# Patient Record
Sex: Female | Born: 1965 | Race: White | Hispanic: No | Smoking: Former smoker
Health system: Southern US, Community
[De-identification: ages and names within clinical notes are randomized; demographics above are authoritative.]

## PROBLEM LIST (undated history)

## (undated) DIAGNOSIS — D649 Anemia, unspecified: Secondary | ICD-10-CM

## (undated) DIAGNOSIS — F419 Anxiety disorder, unspecified: Secondary | ICD-10-CM

## (undated) DIAGNOSIS — F32A Depression, unspecified: Secondary | ICD-10-CM

## (undated) DIAGNOSIS — T7840XA Allergy, unspecified, initial encounter: Secondary | ICD-10-CM

## (undated) DIAGNOSIS — F329 Major depressive disorder, single episode, unspecified: Secondary | ICD-10-CM

## (undated) HISTORY — DX: Major depressive disorder, single episode, unspecified: F32.9

## (undated) HISTORY — DX: Allergy, unspecified, initial encounter: T78.40XA

## (undated) HISTORY — DX: Depression, unspecified: F32.A

## (undated) HISTORY — PX: ENDOMETRIAL ABLATION: SHX621

## (undated) HISTORY — DX: Anxiety disorder, unspecified: F41.9

## (undated) HISTORY — DX: Anemia, unspecified: D64.9

---

## 2007-09-04 ENCOUNTER — Other Ambulatory Visit: Admission: RE | Admit: 2007-09-04 | Discharge: 2007-09-04 | Payer: Self-pay | Admitting: Family Medicine

## 2007-09-04 ENCOUNTER — Encounter: Payer: Self-pay | Admitting: Family Medicine

## 2007-09-04 ENCOUNTER — Ambulatory Visit: Payer: Self-pay | Admitting: Family Medicine

## 2007-09-04 DIAGNOSIS — F329 Major depressive disorder, single episode, unspecified: Secondary | ICD-10-CM

## 2007-09-04 DIAGNOSIS — N926 Irregular menstruation, unspecified: Secondary | ICD-10-CM | POA: Insufficient documentation

## 2007-09-04 DIAGNOSIS — D239 Other benign neoplasm of skin, unspecified: Secondary | ICD-10-CM | POA: Insufficient documentation

## 2007-09-04 LAB — CONVERTED CEMR LAB
Bilirubin Urine: NEGATIVE
Blood in Urine, dipstick: NEGATIVE
Glucose, Urine, Semiquant: NEGATIVE
Ketones, urine, test strip: NEGATIVE
Protein, U semiquant: NEGATIVE

## 2007-09-08 ENCOUNTER — Encounter (INDEPENDENT_AMBULATORY_CARE_PROVIDER_SITE_OTHER): Payer: Self-pay | Admitting: *Deleted

## 2007-09-08 LAB — CONVERTED CEMR LAB
ALT: 21 units/L (ref 0–35)
Albumin: 3.5 g/dL (ref 3.5–5.2)
Alkaline Phosphatase: 75 units/L (ref 39–117)
BUN: 5 mg/dL — ABNORMAL LOW (ref 6–23)
Basophils Absolute: 0 10*3/uL (ref 0.0–0.1)
Calcium: 9.1 mg/dL (ref 8.4–10.5)
Cholesterol: 193 mg/dL (ref 0–200)
Creatinine, Ser: 0.6 mg/dL (ref 0.4–1.2)
HDL: 75.9 mg/dL (ref >39.0)
Hemoglobin: 12.6 g/dL (ref 12.0–15.0)
LDL Cholesterol: 104 mg/dL — ABNORMAL HIGH (ref 0–99)
MCHC: 34.2 g/dL (ref 30.0–36.0)
Monocytes Absolute: 0.6 10*3/uL (ref 0.2–0.7)
Monocytes Relative: 12.8 % — ABNORMAL HIGH (ref 3.0–11.0)
Platelets: 295 10*3/uL (ref 150–400)
Potassium: 3.8 meq/L (ref 3.5–5.1)
RBC: 4.06 M/uL (ref 3.87–5.11)
RDW: 13.6 % (ref 11.5–14.6)
Total Bilirubin: 0.4 mg/dL (ref 0.3–1.2)
Total CHOL/HDL Ratio: 2.5
Triglycerides: 65 mg/dL (ref 0–149)
VLDL: 13 mg/dL (ref 0–40)

## 2007-09-15 ENCOUNTER — Encounter: Payer: Self-pay | Admitting: Family Medicine

## 2007-09-19 ENCOUNTER — Encounter: Admission: RE | Admit: 2007-09-19 | Discharge: 2007-09-19 | Payer: Self-pay | Admitting: Family Medicine

## 2007-09-26 ENCOUNTER — Encounter (INDEPENDENT_AMBULATORY_CARE_PROVIDER_SITE_OTHER): Payer: Self-pay | Admitting: *Deleted

## 2007-09-30 ENCOUNTER — Encounter: Payer: Self-pay | Admitting: Family Medicine

## 2007-10-04 ENCOUNTER — Encounter: Payer: Self-pay | Admitting: Family Medicine

## 2007-10-10 ENCOUNTER — Encounter: Payer: Self-pay | Admitting: Family Medicine

## 2007-10-16 ENCOUNTER — Encounter: Admission: RE | Admit: 2007-10-16 | Discharge: 2007-10-16 | Payer: Self-pay | Admitting: Family Medicine

## 2007-10-22 ENCOUNTER — Encounter: Admission: RE | Admit: 2007-10-22 | Discharge: 2007-10-22 | Payer: Self-pay | Admitting: Family Medicine

## 2007-10-24 ENCOUNTER — Encounter (INDEPENDENT_AMBULATORY_CARE_PROVIDER_SITE_OTHER): Payer: Self-pay | Admitting: *Deleted

## 2010-02-08 ENCOUNTER — Encounter: Admission: RE | Admit: 2010-02-08 | Discharge: 2010-02-08 | Payer: Self-pay | Admitting: Internal Medicine

## 2010-07-20 ENCOUNTER — Ambulatory Visit: Payer: Self-pay | Admitting: Obstetrics & Gynecology

## 2010-07-25 ENCOUNTER — Ambulatory Visit (HOSPITAL_COMMUNITY): Admission: RE | Admit: 2010-07-25 | Discharge: 2010-07-25 | Payer: Self-pay | Admitting: Obstetrics and Gynecology

## 2010-08-10 ENCOUNTER — Ambulatory Visit: Payer: Self-pay | Admitting: Obstetrics and Gynecology

## 2010-09-27 ENCOUNTER — Ambulatory Visit (HOSPITAL_COMMUNITY)
Admission: RE | Admit: 2010-09-27 | Discharge: 2010-09-27 | Payer: Self-pay | Source: Home / Self Care | Attending: Family Medicine | Admitting: Family Medicine

## 2010-10-20 ENCOUNTER — Ambulatory Visit
Admission: RE | Admit: 2010-10-20 | Discharge: 2010-10-20 | Payer: Self-pay | Source: Home / Self Care | Attending: Obstetrics and Gynecology | Admitting: Obstetrics and Gynecology

## 2010-10-22 ENCOUNTER — Encounter: Payer: Self-pay | Admitting: Family Medicine

## 2010-11-09 ENCOUNTER — Ambulatory Visit: Payer: Self-pay | Admitting: Family Medicine

## 2010-12-11 LAB — CBC
HCT: 42.1 % (ref 36.0–46.0)
MCV: 90.5 fL (ref 78.0–100.0)
RBC: 4.65 MIL/uL (ref 3.87–5.11)
RDW: 13.2 % (ref 11.5–15.5)
WBC: 5.9 10*3/uL (ref 4.0–10.5)

## 2010-12-21 NOTE — Progress Notes (Signed)
NAMEKASANDRA, FEHR               ACCOUNT NO.:  [id number]  MEDICAL RECORD NO.:  [id number]           PATIENT TYPE:  LOCATION:  [facility]                     FACILITY:  PHYSICIAN:  Tinnie Gens, MD        DATE OF BIRTH:  1966-09-19  DATE OF SERVICE:                                 CLINIC NOTE  CHIEF COMPLAINT:  Postop hysteroscopy with hydrothermal ablation followup.  HISTORY OF PRESENT ILLNESS:  Ms. Hauk is a 45 year old, gravida 2, para 1-0-1-1, who comes in for a followup appointment for status post hysteroscopy with hydrothermal ablation on September 27, 2010.  The patient has a history of dysfunctional uterine bleeding for the past 6 months.  Prior to her surgery, she underwent endometrial sampling and pelvic sonography.  Endometrial sampling showed benign proliferative- type endometrium, no evidence of hyperplasia or malignancy.  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  This fibroid is submucosal in nature and deviated endometrial stripe.  Endometrium was 5 mm in maximal thickness.  The patient has been doing well since her surgery.  She states she is continuing to have some spotting, goes about 2 pads a day.  She denies any pain at this time.  Pathology reports and operative reports were reviewed today.  The patient's main question for today is when she would stop bleeding.  She has been taking ibuprofen initially after her surgery for pain.  She states that this controlled her pain well.  She does not need to take ibuprofen anymore.  PHYSICAL EXAMINATION:  VITAL SIGNS:  Temperature of 97.6, pulse 96, blood pressure 106/76, weight 155.8 pounds, height 64 inches. GENERAL:  Pleasant women, appears stated age, in no acute distress. ABDOMEN:  Soft, nondistended, and nontender.  Benign abdomen. GYNECOLOGIC:  External genitalia within normal limits.  No lesions or abnormalities noted.   Vagina is well rugated with some mucous discharge that is tanned in nature.  The tip is closed from the anterior position. The uterus is small and retroverted.  Adnexa without masses or tenderness.  Also no uterine tenderness.  However, is not directly palpable. RECTAL:  Deferred today. EXTREMITIES:  No clubbing, cyanosis, edema, or erythema noted.  ASSESSMENT AND PLAN:  Ms. Barcenas is a 45 year old female here for postop followup, following hysteroscopy with hydrothermal ablation.  I did discuss this patient with Dr. Shawnie Pons.  She agreed with the plan.  The patient's bleeding has greatly decreased since her surgery.  She is going with 2 pads a day at this time.  I reassured the patient that her bleeding should stop by 4-6 weeks completely.  We will have the patient follow up in 6-8 weeks after her surgery to ensure that her bleeding has lightened and stopped at that point of time.  Of note, on review of the records, the patient has not had a Pap smear since 2008.  Pap smear at that time was within normal limits.  She states she has a distant history of abnormal Pap smear, but has never been referred for a  colposcopy.  We will need to obtain a Pap smear once in the next few months.  Also of note, the patient did have elevated [facility] sent from her primary care physician.  This FSH was 36.2, which is somewhat elevated. The patient is obviously still bleeding and therefore does not seem to be perimenopausal at this time.  She does have a history of irregular menses since she was a teenager.  She states lab work was not obtained or at least not send over from primary care physician.  If the patient has any symptoms, we will follow up with perimenopausal workup.    ______________________________ Tinnie Gens, MD   ______________________________ Tinnie Gens, MD   TP/MEDQ  D:  10/20/2010  T:  10/21/2010  Job:  [id number]

## 2011-01-01 ENCOUNTER — Ambulatory Visit: Payer: Self-pay | Admitting: Occupational Therapy

## 2011-01-01 ENCOUNTER — Other Ambulatory Visit: Payer: Self-pay

## 2011-01-01 DIAGNOSIS — N920 Excessive and frequent menstruation with regular cycle: Secondary | ICD-10-CM

## 2011-01-01 DIAGNOSIS — Z09 Encounter for follow-up examination after completed treatment for conditions other than malignant neoplasm: Secondary | ICD-10-CM

## 2011-01-02 NOTE — Progress Notes (Signed)
Heather Rocha, Heather Rocha               ACCOUNT NO.:  [id number]  MEDICAL RECORD NO.:  [id number]           PATIENT TYPE:  A  LOCATION:  [facility]                   FACILITY:  [facility]  PHYSICIAN:  Maylon Cos, CNM    DATE OF BIRTH:  1966-01-24  DATE OF SERVICE:  01/01/2011                                 CLINIC NOTE  CHIEF COMPLAINT:  Postop recheck status post hysteroscopy with ablation on  September 27, 2010.  HISTORY OF PRESENT ILLNESS:  Ms. Heather Rocha had an ablation on September 27, 2010, for dysfunctional uterine bleeding from 6 months.  She had a postop visit on October 19, 2010, was still having some light bleeding at that time.  Today, she reports the bleeding stopped in January and has not had any bleeding since.  She denies any pain.  She did have some questions regarding her menstrual cycles.  She had a short period of time where her breast became tender and she had some irritability and mood changes in February, but no bleeding at that time other than that she is without complaints.  Her last Pap smear was 3 years ago.  She does have a remote history of abnormal Pap smear that did not require any follow up, but she is not sure about the details of that.  The patient complains of some vaginal odor and reports that she douches on occasion, but no complaints of discharge.  SUBJECTIVE:  GENERAL:  A well-appearing adult female in no acute distress. PELVIC:  External genitalia within normal limits.  Vaginal mucosa normal.  Cervix normal-appearing, small amount of clear mucus discharge. No cervical motion tenderness.  Uterus is not enlarged and nontender. Adnexa are not enlarged and nontender.  ASSESSMENT AND PLAN:  A 45 year old G2, P1-0-1-1 here for postop followup and has had a normal postop course.  Pap smear was collected today.  The patient was instructed to follow up if she began having more problems with abnormal bleeding.  Otherwise, will follow up as indicated by her Pap  smear.          ______________________________ Maylon Cos, CNM    SS/MEDQ  D:  01/01/2011  T:  01/02/2011  Job:  [id number]

## 2011-05-21 ENCOUNTER — Other Ambulatory Visit (HOSPITAL_COMMUNITY): Payer: Self-pay | Admitting: Internal Medicine

## 2011-05-21 DIAGNOSIS — Z1231 Encounter for screening mammogram for malignant neoplasm of breast: Secondary | ICD-10-CM

## 2011-05-23 ENCOUNTER — Ambulatory Visit (HOSPITAL_COMMUNITY)
Admission: RE | Admit: 2011-05-23 | Discharge: 2011-05-23 | Disposition: A | Discharge status: Home / Self Care | Payer: Self-pay | Source: Ambulatory Visit | Attending: Internal Medicine | Admitting: Internal Medicine

## 2011-05-23 DIAGNOSIS — Z1231 Encounter for screening mammogram for malignant neoplasm of breast: Secondary | ICD-10-CM

## 2013-10-19 ENCOUNTER — Ambulatory Visit (INDEPENDENT_AMBULATORY_CARE_PROVIDER_SITE_OTHER): Payer: Managed Care, Other (non HMO) | Admitting: Family Medicine

## 2013-10-19 ENCOUNTER — Ambulatory Visit: Payer: Managed Care, Other (non HMO)

## 2013-10-19 VITALS — BP 122/88 | HR 91 | Temp 98.6°F | Resp 16 | Ht 63.0 in | Wt 162.4 lb

## 2013-10-19 DIAGNOSIS — Z1231 Encounter for screening mammogram for malignant neoplasm of breast: Secondary | ICD-10-CM

## 2013-10-19 DIAGNOSIS — D171 Benign lipomatous neoplasm of skin and subcutaneous tissue of trunk: Secondary | ICD-10-CM

## 2013-10-19 DIAGNOSIS — L989 Disorder of the skin and subcutaneous tissue, unspecified: Secondary | ICD-10-CM

## 2013-10-19 DIAGNOSIS — D1779 Benign lipomatous neoplasm of other sites: Secondary | ICD-10-CM

## 2013-10-19 LAB — POCT CBC
GRANULOCYTE PERCENT: 51.2 % (ref 37–80)
HEMATOCRIT: 41.1 % (ref 37.7–47.9)
HEMOGLOBIN: 12.9 g/dL (ref 12.2–16.2)
Lymph, poc: 2.6 (ref 0.6–3.4)
MCH, POC: 29.7 pg (ref 27–31.2)
MCHC: 31.4 g/dL — AB (ref 31.8–35.4)
MCV: 94.5 fL (ref 80–97)
MID (cbc): 0.4 (ref 0–0.9)
MPV: 7.1 fL (ref 0–99.8)
POC GRANULOCYTE: 3.1 (ref 2–6.9)
POC LYMPH %: 42.2 % (ref 10–50)
POC MID %: 6.6 %M (ref 0–12)
Platelet Count, POC: 268 10*3/uL (ref 142–424)
RBC: 4.35 M/uL (ref 4.04–5.48)
RDW, POC: 13.3 %
WBC: 6.1 10*3/uL (ref 4.6–10.2)

## 2013-10-19 NOTE — Progress Notes (Signed)
Subjective:    Patient ID: Heather Rocha, female    DOB: Oct 08, 1965, 48 y.o.   MRN: [id number]  HPI Primary Physician: Lamar Blinks, MD  Chief Complaint: "Knot" on left side of her back  HPI: 48 y.o. female with history below presents with a reported 1 day history of swelling mass along the left flank. Patient reports the previous evening she was drying off after a shower and felt a lump along her left flank that she had never felt previously. She felt along the contralateral side and did not feel a similar one. She had recently gotten a massage from one of her coworkers the day before, she works as a Geophysicist/field seismologist, so she called her and asked her if she felt the area in question and the coworker replied that she did not. The patient has been scared to touch the lesion, lean back on the lesion when sitting, or lay on the lesion. She has remained afebrile and without chills. She has had night sweats for some time now, but believes that she is perimenopausal. No pruritis.   Of note, her mother had Hodgkin's lymphoma, breast cancer, and "skin" cancer. Her father also had lung cancer and passed at age 72. He was a smoker and did not go to doctor's the patient reports.   The patient was an on and off smoker from ages [age]. She would not smoke if at work or dating someone that did not smoke. A pack would last about 3 days. She has used Vape since May 2014.    Past Medical History  Diagnosis Date  . Allergy   . Anemia   . Anxiety   . Depression      Home Meds: Prior to Admission medications   Medication Sig Start Date End Date Taking? Authorizing Provider  ferrous sulfate (IRON SUPPLEMENT) 325 (65 FE) MG tablet Take 325 mg by mouth daily with breakfast.   Yes Historical Provider, MD  oxybutynin (DITROPAN) 5 MG tablet Take 5 mg by mouth 2 (two) times daily.   Yes Historical Provider, MD    Allergies: No Known Allergies  History   Social History  . Marital Status: Divorced      Spouse Name: N/A    Number of Children: N/A  . Years of Education: N/A   Occupational History  . Not on file.   Social History Main Topics  . Smoking status: Former Research scientist (life sciences)  . Smokeless tobacco: Not on file  . Alcohol Use: Not on file  . Drug Use: Not on file  . Sexual Activity: Not on file   Other Topics Concern  . Not on file   Social History Narrative  . No narrative on file      Review of Systems  Constitutional: Negative for fever, chills, appetite change, fatigue and unexpected weight change.  Respiratory: Negative for cough, shortness of breath, wheezing and stridor.   Cardiovascular: Negative for chest pain, palpitations and leg swelling.  Musculoskeletal: Positive for arthralgias and back pain.  Skin: Negative for color change, pallor, rash and wound.  Hematological: Negative for adenopathy. Does not bruise/bleed easily.       Objective:   Physical Exam  Physical Exam: Blood pressure 122/88, pulse 91, temperature 98.6 F (37 C), temperature source Oral, resp. rate 16, height 5\' 3"  (1.6 m), weight 162 lb 6.4 oz (73.664 kg), SpO2 99.00%., Body mass index is 28.78 kg/(m^2). General: Well developed, well nourished, in no acute distress. Head: Normocephalic, atraumatic, eyes without discharge,  sclera non-icteric, nares are without discharge. Bilateral auditory canals clear, TM's are without perforation, pearly grey and translucent with reflective cone of light bilaterally. Oral cavity moist, posterior pharynx without exudate, erythema, peritonsillar abscess, or post nasal drip. Uvula midline.   Neck: Supple. No thyromegaly. Full ROM. No lymphadenopathy along neck or supraclavicular regions.  Lungs: Clear bilaterally to auscultation without wheezes, rales, or rhonchi. Breathing is unlabored. Heart: RRR with S1 S2. No murmurs, rubs, or gallops appreciated. Msk:  Strength and tone normal for age. Extremities/Skin: Warm and dry. No clubbing or cyanosis. No edema. No  rashes or suspicious lesions. Back: 2 cm x 1 cm mobile non tender slightly firm mass just to the inferior aspect of the 12th rib along the left flank. No erythema or ecchymosis. No induration or fluctuance.  Neuro: Alert and oriented X 3. Moves all extremities spontaneously. Gait is normal. CNII-XII grossly in tact. Psych:  Responds to questions appropriately with a normal affect.    Labs: Results for orders placed in visit on 10/19/13  POCT CBC      Result Value Range   WBC 6.1  4.6 - 10.2 K/uL   Lymph, poc 2.6  0.6 - 3.4   POC LYMPH PERCENT 42.2  10 - 50 %L   MID (cbc) 0.4  0 - 0.9   POC MID % 6.6  0 - 12 %M   POC Granulocyte 3.1  2 - 6.9   Granulocyte percent 51.2  37 - 80 %G   RBC 4.35  4.04 - 5.48 M/uL   Hemoglobin 12.9  12.2 - 16.2 g/dL   HCT, POC 41.1  37.7 - 47.9 %   MCV 94.5  80 - 97 fL   MCH, POC 29.7  27 - 31.2 pg   MCHC 31.4 (*) 31.8 - 35.4 g/dL   RDW, POC 13.3     Platelet Count, POC 268  142 - 424 K/uL   MPV 7.1  0 - 99.8 fL    CXR:  [facility] reading (PRIMARY) by  Dr. Carlota Raspberry. Negative       Assessment & Plan:  48 year old female with lipoma and here to establish care -Offered patient general surgery referral for further evaluation/second opinion. She defers this at this time. She will accept this should the lesion change in any way -Advised patient to RTC in 4-6 if lesion is bother some, sooner if worse or with changes -Screening [facility] referral made   Christell Faith, MHS, PA-C [facility] [address] [place], [place] [address] [place] 10/19/2013 6:15 PM

## 2013-10-21 NOTE — Progress Notes (Signed)
Xray read and patient discussed with Christell Faith, PA-C. Agree with assessment and plan of care per his note.

## 2013-10-28 ENCOUNTER — Telehealth (HOSPITAL_COMMUNITY): Payer: Self-pay | Admitting: Psychiatry

## 2013-11-01 NOTE — Telephone Encounter (Signed)
Erroneous Encounter. Please ignore.

## 2014-04-12 ENCOUNTER — Ambulatory Visit (INDEPENDENT_AMBULATORY_CARE_PROVIDER_SITE_OTHER): Payer: Managed Care, Other (non HMO) | Admitting: Family Medicine

## 2014-04-12 ENCOUNTER — Other Ambulatory Visit: Payer: Self-pay | Admitting: Family Medicine

## 2014-04-12 VITALS — BP 98/80 | HR 75 | Temp 97.7°F | Resp 16 | Ht 62.5 in | Wt 170.6 lb

## 2014-04-12 DIAGNOSIS — R400 Somnolence: Secondary | ICD-10-CM

## 2014-04-12 DIAGNOSIS — Z13220 Encounter for screening for lipoid disorders: Secondary | ICD-10-CM

## 2014-04-12 DIAGNOSIS — R0989 Other specified symptoms and signs involving the circulatory and respiratory systems: Secondary | ICD-10-CM

## 2014-04-12 DIAGNOSIS — R0609 Other forms of dyspnea: Secondary | ICD-10-CM

## 2014-04-12 DIAGNOSIS — Z0000 Encounter for general adult medical examination without abnormal findings: Secondary | ICD-10-CM

## 2014-04-12 DIAGNOSIS — R3915 Urgency of urination: Secondary | ICD-10-CM

## 2014-04-12 DIAGNOSIS — R5383 Other fatigue: Secondary | ICD-10-CM

## 2014-04-12 DIAGNOSIS — G4710 Hypersomnia, unspecified: Secondary | ICD-10-CM

## 2014-04-12 DIAGNOSIS — Z20820 Contact with and (suspected) exposure to varicella: Secondary | ICD-10-CM

## 2014-04-12 DIAGNOSIS — Z124 Encounter for screening for malignant neoplasm of cervix: Secondary | ICD-10-CM

## 2014-04-12 DIAGNOSIS — D1739 Benign lipomatous neoplasm of skin and subcutaneous tissue of other sites: Secondary | ICD-10-CM

## 2014-04-12 DIAGNOSIS — R5381 Other malaise: Secondary | ICD-10-CM

## 2014-04-12 DIAGNOSIS — R0683 Snoring: Secondary | ICD-10-CM

## 2014-04-12 DIAGNOSIS — D171 Benign lipomatous neoplasm of skin and subcutaneous tissue of trunk: Secondary | ICD-10-CM

## 2014-04-12 LAB — CBC WITH DIFFERENTIAL/PLATELET
Basophils Absolute: 0 10*3/uL (ref 0.0–0.1)
Basophils Relative: 0 % (ref 0–1)
EOS ABS: 0.1 10*3/uL (ref 0.0–0.7)
EOS PCT: 2 % (ref 0–5)
HEMATOCRIT: 39.6 % (ref 36.0–46.0)
HEMOGLOBIN: 13.5 g/dL (ref 12.0–15.0)
LYMPHS ABS: 2 10*3/uL (ref 0.7–4.0)
LYMPHS PCT: 34 % (ref 12–46)
MCH: 29.8 pg (ref 26.0–34.0)
MCHC: 34.1 g/dL (ref 30.0–36.0)
MCV: 87.4 fL (ref 78.0–100.0)
MONOS PCT: 7 % (ref 3–12)
Monocytes Absolute: 0.4 10*3/uL (ref 0.1–1.0)
Neutro Abs: 3.3 10*3/uL (ref 1.7–7.7)
Neutrophils Relative %: 57 % (ref 43–77)
PLATELETS: 328 10*3/uL (ref 150–400)
RBC: 4.53 MIL/uL (ref 3.87–5.11)
RDW: 13.4 % (ref 11.5–15.5)
WBC: 5.8 10*3/uL (ref 4.0–10.5)

## 2014-04-12 MED ORDER — TOLTERODINE TARTRATE ER 2 MG PO CP24: 2.0000 mg | ORAL_CAPSULE | Freq: Every day | ORAL | Status: DC

## 2014-04-12 NOTE — Progress Notes (Signed)
[facility] [address], [place] [address] [phone]  Date:  04/12/2014   Name:  Heather Rocha   DOB:  May 05, 1966   MRN:  [id number]  PCP:  Lamar Blinks, MD    Chief Complaint: Annual Exam   History of Present Illness:  Heather Rocha is a 48 y.o. very pleasant female patient who presents with the following:  Here today for a CPE.  She is generally healthy.  She had been on ditropan for her bladder. She quit taking it recently and she felt better- she got rid of her headaches and diarrhea/ nausea.  She stopped her ditropan about 2 weeks ago. She has had bladder issues for a couple of years.  She had been just taking it before work, and it did seem to help with her frequency and urgency.  She does have to wear a pad right now.   Also, she has noted some hot flashes. Also, her roommate has noted that she does some snoring and "jerking" while she is asleep at night.  She does notice some daytime somnolence.    Also, a few years ago a limb hit her in the head and she needed stitches.  Her mother wondered if this might lead to an aneurysm. She does feel tired a lot of the time.  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    She does notice a small "fat pocket" on her left flank.  Not sure if this might be of any concern. Also she has not had chicken pox as far as she knows; wonders if she is at risk for this disease and if she needs a shingles shot  She did have a mammogram about 3 years ago.   She does drink coffee every day.   She is fasting today for labs  She does not have menses now that she had an ablation.    Patient Active Problem List   Diagnosis Date Noted  . MOLE 09/04/2007  . DEPRESSION 09/04/2007  . IRREGULAR MENSTRUAL CYCLE 09/04/2007    Past Medical History  Diagnosis Date  . Allergy   . Anemia   . Anxiety   . Depression     Past Surgical History  Procedure Laterality Date  . Endometrial  ablation      History  Substance Use Topics  . Smoking status: Former Research scientist (life sciences)  . Smokeless tobacco: Not on file  . Alcohol Use: Not on file    Family History  Problem Relation Age of Onset  . Cancer Mother     Hodgkin's Lymphoma, Breast, "skin"   . Cancer Father     Lung, smoker  . Mental illness Sister   . Cancer Maternal Grandmother   . Stroke Paternal Grandmother     No Known Allergies  Medication list has been reviewed and updated.  Current Outpatient Prescriptions on File Prior to Visit  Medication Sig Dispense Refill  . ferrous sulfate (IRON SUPPLEMENT) 325 (65 FE) MG tablet Take 325 mg by mouth daily with breakfast.      . oxybutynin (DITROPAN) 5 MG tablet Take 5 mg by mouth 2 (two) times daily.       No current facility-administered medications on file prior to visit.    Review of Systems:  As per HPI- otherwise negative.   Physical Examination: Filed Vitals:   04/12/14 1143  BP: 98/80  Pulse: 75  Temp: 97.7 F (36.5 C)  Resp: 16   Filed Vitals:   04/12/14 1143  Height: 5' 2.5" (1.588 m)  Weight: 170 lb 9.6 oz (77.384 kg)   Body mass index is 30.69 kg/(m^2). Ideal Body Weight: Weight in (lb) to have BMI = 25: 138.6  GEN: WDWN, NAD, Non-toxic, A & O x 3, overweight, looks well.  Mild sunburn HEENT: Atraumatic, Normocephalic. Neck supple. No masses, No LAD.  Bilateral TM wnl, oropharynx normal.  PEERL,EOMI.   Ears and Nose: No external deformity. CV: RRR, No M/G/R. No JVD. No thrill. No extra heart sounds. PULM: CTA B, no wheezes, crackles, rhonchi. No retractions. No resp. distress. No accessory muscle use. ABD: S, NT, ND, +BS. No rebound. No HSM. EXTR: No c/c/e NEURO Normal gait.  PSYCH: Normally interactive. Conversant. Not depressed or anxious appearing.  Calm demeanor.  Breast: normal exam, no masses/ dimpling/ discharge Pelvic: normal, no vaginal lesions or discharge. Uterus normal, no CMT, no adnexal tendereness or masses There is a small,  mobile, rounded lesion under the dermis of the left flank.  Suspect a cyst or lipoma.  The size of a halved ping-pong ball.    Assessment and Plan: Snoring - Plan: Nocturnal polysomnography (NPSG)  Daytime somnolence - Plan: Nocturnal polysomnography (NPSG)  Physical exam - Plan: CBC with Differential, Comprehensive metabolic panel  Other malaise and fatigue - Plan: TSH  Screening for cervical cancer - Plan: Pap IG, CT/NG w/ reflex HPV when ASC-U  Varicella contact - Plan: Varicella zoster antibody, IgM  Screening for hyperlipidemia - Plan: Lipid panel  Lipoma of flank - Plan: [place] Misc Soft Tissue  Labs pending as above.  Suspect she may have had mild chicken pox- will check a titer Pap pending, she will schedule a mammogram Fatigue and daytime somnolence: check a sleep study See patient instructions for more details.     Signed Lamar Blinks, MD

## 2014-04-12 NOTE — Patient Instructions (Addendum)
Please schedule a mammogram at your convenience- you might try the breast center at [place] or [place] Address: [address], [place], [place] [address] [phone]  [place],  [address]  Phone: [phone]  We will arrange for a sleep study, and an ultrasound of the area on your flank.  We will give you a call regarding these tests.    I will be in touch with your labs asap.  If you could bring in your vaccination schedule that would be great.

## 2014-04-13 LAB — COMPREHENSIVE METABOLIC PANEL
ALT: 56 U/L — AB (ref 0–35)
AST: 37 U/L (ref 0–37)
Albumin: 4.3 g/dL (ref 3.5–5.2)
Alkaline Phosphatase: 115 U/L (ref 39–117)
BILIRUBIN TOTAL: 0.6 mg/dL (ref 0.2–1.2)
BUN: 12 mg/dL (ref 6–23)
CALCIUM: 9 mg/dL (ref 8.4–10.5)
CHLORIDE: 104 meq/L (ref 96–112)
CO2: 26 meq/L (ref 19–32)
CREATININE: 0.57 mg/dL (ref 0.50–1.10)
GLUCOSE: 80 mg/dL (ref 70–99)
Potassium: 4.3 mEq/L (ref 3.5–5.3)
Sodium: 140 mEq/L (ref 135–145)
TOTAL PROTEIN: 7.1 g/dL (ref 6.0–8.3)

## 2014-04-13 LAB — LIPID PANEL
CHOLESTEROL: 202 mg/dL — AB (ref 0–200)
HDL: 73 mg/dL (ref >39)
LDL Cholesterol: 118 mg/dL — ABNORMAL HIGH (ref 0–99)
Total CHOL/HDL Ratio: 2.8 Ratio
Triglycerides: 53 mg/dL (ref <150)
VLDL: 11 mg/dL (ref 0–40)

## 2014-04-13 LAB — TSH: TSH: 3.46 u[IU]/mL (ref 0.350–4.500)

## 2014-04-13 LAB — PAP IG, CT-NG, RFX HPV ASCU
CHLAMYDIA PROBE AMP: NEGATIVE
GC PROBE AMP: NEGATIVE

## 2014-04-13 LAB — VARICELLA ZOSTER ANTIBODY, IGM: Varicella Zoster Ab IgM: 0.04 {ISR} (ref <0.91)

## 2014-04-14 ENCOUNTER — Encounter: Payer: Self-pay | Admitting: Family Medicine

## 2014-04-15 ENCOUNTER — Ambulatory Visit
Admission: RE | Admit: 2014-04-15 | Discharge: 2014-04-15 | Disposition: A | Discharge status: Home / Self Care | Payer: Managed Care, Other (non HMO) | Source: Ambulatory Visit | Attending: Family Medicine | Admitting: Family Medicine

## 2014-04-15 DIAGNOSIS — D171 Benign lipomatous neoplasm of skin and subcutaneous tissue of trunk: Secondary | ICD-10-CM

## 2014-04-15 LAB — VARICELLA ZOSTER ANTIBODY, IGG: Varicella IgG: 517.9 Index — ABNORMAL HIGH (ref <135.00)

## 2014-04-18 ENCOUNTER — Encounter: Payer: Self-pay | Admitting: Family Medicine

## 2014-05-03 ENCOUNTER — Telehealth: Payer: Self-pay | Admitting: *Deleted

## 2014-05-03 NOTE — Telephone Encounter (Signed)
Spoke with patient regarding scheduling for a sleep study, per patient has an appointment for Sept. 16, 15 at [facility].

## 2014-05-05 ENCOUNTER — Ambulatory Visit (INDEPENDENT_AMBULATORY_CARE_PROVIDER_SITE_OTHER): Payer: Managed Care, Other (non HMO) | Admitting: Family Medicine

## 2014-05-05 ENCOUNTER — Ambulatory Visit (INDEPENDENT_AMBULATORY_CARE_PROVIDER_SITE_OTHER): Payer: Managed Care, Other (non HMO)

## 2014-05-05 ENCOUNTER — Telehealth: Payer: Self-pay

## 2014-05-05 VITALS — BP 114/72 | HR 83 | Temp 97.8°F | Resp 16 | Ht 63.0 in | Wt 172.4 lb

## 2014-05-05 DIAGNOSIS — M25551 Pain in right hip: Secondary | ICD-10-CM

## 2014-05-05 DIAGNOSIS — M25559 Pain in unspecified hip: Secondary | ICD-10-CM

## 2014-05-05 MED ORDER — METHOCARBAMOL 500 MG PO TABS: 500.0000 mg | ORAL_TABLET | Freq: Three times a day (TID) | ORAL | Status: DC | PRN

## 2014-05-05 NOTE — Progress Notes (Signed)
[facility] [address] [place] [address] [phone]  Date:  05/05/2014   Name:  Ajani Schnieders   DOB:  1966-03-09   MRN:  [id number]  PCP:  Lamar Blinks, MD    Chief Complaint: Hip Pain   History of Present Illness:  Bayleigh Loflin is a 48 y.o. very pleasant female patient who presents with the following:  Here today with concern regarding a problem with her right hip.  She notes an intermittent pain in the right hip- seems to occur with walking.  Stopping for a moment seems to help.  She feels sore in the right side of her behind.  She is not aware of any injury. She notes onset of this issue over the last 5 days or so.  It was worse on Saturday- better the last couple of days.  Today is Wednesday.  She has never had any trouble with her hip in the past.  Her right buttock is also sore- she thinks it may be in spasm.   She does not get menses since her ablation.    Patient Active Problem List   Diagnosis Date Noted  . MOLE 09/04/2007  . DEPRESSION 09/04/2007  . IRREGULAR MENSTRUAL CYCLE 09/04/2007    Past Medical History  Diagnosis Date  . Allergy   . Anemia   . Anxiety   . Depression     Past Surgical History  Procedure Laterality Date  . Endometrial ablation      History  Substance Use Topics  . Smoking status: Former Research scientist (life sciences)  . Smokeless tobacco: Not on file  . Alcohol Use: Not on file    Family History  Problem Relation Age of Onset  . Cancer Mother     Hodgkin's Lymphoma, Breast, "skin"   . Cancer Father     Lung, smoker  . Mental illness Sister   . Cancer Maternal Grandmother   . Stroke Paternal Grandmother     No Known Allergies  Medication list has been reviewed and updated.  Current Outpatient Prescriptions on File Prior to Visit  Medication Sig Dispense Refill  . ferrous sulfate (IRON SUPPLEMENT) 325 (65 FE) MG tablet Take 325 mg by mouth daily with breakfast.      . Omega-3 Fatty Acids (FISH OIL)  1000 MG CAPS Take by mouth daily.      Marland Kitchen oxybutynin (DITROPAN) 5 MG tablet Take 5 mg by mouth 2 (two) times daily.      Marland Kitchen tolterodine (DETROL LA) 2 MG 24 hr capsule Take 1 capsule (2 mg total) by mouth daily.  30 capsule  5   No current facility-administered medications on file prior to visit.    Review of Systems:  As per HPI- otherwise negative. She is doing well with the new bladder control medication  Physical Examination: Filed Vitals:   05/05/14 1547  BP: 114/72  Pulse: 83  Temp: 97.8 F (36.6 C)  Resp: 16   Filed Vitals:   05/05/14 1547  Height: 5\' 3"  (1.6 m)  Weight: 172 lb 6.4 oz (78.2 kg)   Body mass index is 30.55 kg/(m^2). Ideal Body Weight: Weight in (lb) to have BMI = 25: 140.8  GEN: WDWN, NAD, Non-toxic, A & O x 3, overweight, looks well HEENT: Atraumatic, Normocephalic. Neck supple. No masses, No LAD. Ears and Nose: No external deformity. CV: RRR, No M/G/R. No JVD. No thrill. No extra heart sounds. PULM: CTA B, no wheezes, crackles, rhonchi. No retractions. No  resp. distress. No accessory muscle use. ABD: S, NT, ND No inguinal hernia EXTR: No c/c/e NEURO Normal gait.  PSYCH: Normally interactive. Conversant. Not depressed or anxious appearing.  Calm demeanor.  Right hip:  She has full ROM to flexion/ extension/ internal and external rotation.  No pain at this time, not able to reproduce her tenderness  Normal strength and sensation and DTR both LE Right buttock is slightly tender and in spasm  [facility] reading (PRIMARY) by  Dr. Lorelei Pont. Right hip: negative for any fracture or dislocation. RIGHT HIP - COMPLETE 2+ VIEW  COMPARISON: None.  FINDINGS: There is no evidence of hip fracture or dislocation. There is no evidence of arthropathy or other focal bone abnormality.  IMPRESSION: Negative.   Assessment and Plan: Pain in right hip - Plan: DG Hip Complete Right, methocarbamol (ROBAXIN) 500 MG tablet  Possible labral tear vs more benign cause of hip  pain.  At this time she is not ready for MRI or ortho referral.  Will treat conservatively and see how she does over the next 1-2 weeks; she will be in touch with her progress  Robaxin for buttock muscle spasm  Signed Lamar Blinks, MD

## 2014-05-05 NOTE — Patient Instructions (Signed)
Try the muscle relaxer as needed but remember it can make you feel sleepy.  I will be in touch if the radiologist says any thing about your hip films.   Let me know if your hip is not better in the next 1-2 weeks; Sooner if worse.

## 2014-05-05 NOTE — Telephone Encounter (Signed)
Heather Rocha states the [employer] company denied the in-lab sleep study but they did approve the home sleep study. Can the order please be changed if doctor wants to move forward with the home sleep study. CB # [id number]

## 2014-05-07 NOTE — Telephone Encounter (Signed)
An in- lab sleep study is fine.  Ok to change [employer]

## 2014-05-07 NOTE — Telephone Encounter (Signed)
Pt is scheduled for sleep study on 06/16/14 at 800 pm

## 2014-05-13 ENCOUNTER — Other Ambulatory Visit: Payer: Self-pay | Admitting: Family Medicine

## 2014-05-13 DIAGNOSIS — Z803 Family history of malignant neoplasm of breast: Secondary | ICD-10-CM

## 2014-05-20 ENCOUNTER — Ambulatory Visit (HOSPITAL_COMMUNITY)
Admission: RE | Admit: 2014-05-20 | Discharge: 2014-05-20 | Disposition: A | Discharge status: Home / Self Care | Payer: Managed Care, Other (non HMO) | Source: Ambulatory Visit | Attending: Family Medicine | Admitting: Family Medicine

## 2014-05-20 ENCOUNTER — Other Ambulatory Visit: Payer: Self-pay | Admitting: Family Medicine

## 2014-05-20 DIAGNOSIS — Z803 Family history of malignant neoplasm of breast: Secondary | ICD-10-CM

## 2014-05-20 DIAGNOSIS — Z1231 Encounter for screening mammogram for malignant neoplasm of breast: Secondary | ICD-10-CM

## 2014-06-16 ENCOUNTER — Encounter (HOSPITAL_BASED_OUTPATIENT_CLINIC_OR_DEPARTMENT_OTHER): Payer: Managed Care, Other (non HMO)

## 2014-08-06 ENCOUNTER — Ambulatory Visit (INDEPENDENT_AMBULATORY_CARE_PROVIDER_SITE_OTHER): Payer: Managed Care, Other (non HMO) | Admitting: Family Medicine

## 2014-08-06 VITALS — BP 124/82 | HR 85 | Temp 98.8°F | Resp 16 | Ht 63.0 in | Wt 170.0 lb

## 2014-08-06 DIAGNOSIS — R059 Cough, unspecified: Secondary | ICD-10-CM

## 2014-08-06 DIAGNOSIS — R0982 Postnasal drip: Secondary | ICD-10-CM

## 2014-08-06 DIAGNOSIS — R05 Cough: Secondary | ICD-10-CM

## 2014-08-06 DIAGNOSIS — J029 Acute pharyngitis, unspecified: Secondary | ICD-10-CM

## 2014-08-06 DIAGNOSIS — J069 Acute upper respiratory infection, unspecified: Secondary | ICD-10-CM

## 2014-08-06 LAB — POCT CBC
GRANULOCYTE PERCENT: 64.6 % (ref 37–80)
HEMATOCRIT: 42.4 % (ref 37.7–47.9)
HEMOGLOBIN: 14.33 g/dL (ref 12.2–16.2)
LYMPH, POC: 2.1 (ref 0.6–3.4)
MCH, POC: 30.6 pg (ref 27–31.2)
MCHC: 33.6 g/dL (ref 31.8–35.4)
MCV: 91.1 fL (ref 80–97)
MID (cbc): 0.7 (ref 0–0.9)
MPV: 6.1 fL (ref 0–99.8)
POC GRANULOCYTE: 5 (ref 2–6.9)
POC LYMPH PERCENT: 26.7 %L (ref 10–50)
POC MID %: 8.7 %M (ref 0–12)
Platelet Count, POC: 304 10*3/uL (ref 142–424)
RBC: 4.66 M/uL (ref 4.04–5.48)
RDW, POC: 12.7 %
WBC: 7.8 10*3/uL (ref 4.6–10.2)

## 2014-08-06 LAB — POCT INFLUENZA A/B
INFLUENZA B, POC: NEGATIVE
Influenza A, POC: NEGATIVE

## 2014-08-06 LAB — POCT RAPID STREP A (OFFICE): Rapid Strep A Screen: NEGATIVE

## 2014-08-06 MED ORDER — IPRATROPIUM BROMIDE 0.03 % NA SOLN: 2.0000 | Freq: Four times a day (QID) | NASAL | Status: DC

## 2014-08-06 MED ORDER — HYDROCODONE-HOMATROPINE 5-1.5 MG/5ML PO SYRP
5.0000 mL | ORAL_SOLUTION | Freq: Three times a day (TID) | ORAL | Status: DC | PRN
Start: 2014-08-06 — End: 2015-05-03

## 2014-08-06 NOTE — Patient Instructions (Signed)
Use the cough syrup as needed for cough.  Rest and drink plenty of fluids.  Use the nasal spray as needed for post- nasal drip and sore throat Let me know if you are not feeling better in the next few days- Sooner if worse.

## 2014-08-06 NOTE — Progress Notes (Signed)
[facility] [address], [place] [address] [phone]  Date:  08/06/2014   Name:  Heather Rocha   DOB:  10-15-1965   MRN:  [id number]  PCP:  Lamar Blinks, MD    Chief Complaint: Sore Throat; Cough; Chills; and Headache   History of Present Illness:  Heather Rocha is a 48 y.o. very pleasant female patient who presents with the following:  Generally healthy, here today with illnes,  She had to leave work early on Tuesday due to illness- today is Friday.  She felt a little better, but then felt very tired again today.   She notes a bad ST, fatigue, ears and head hurt,  She is also coughing some- it is "a hard cough."   They are not sure if she had a fever- she has had some sweats, and chills.  The first couple of days she "slept all day."   Delsym does seem to help with her cough.   Patient Active Problem List   Diagnosis Date Noted  . MOLE 09/04/2007  . DEPRESSION 09/04/2007  . IRREGULAR MENSTRUAL CYCLE 09/04/2007    Past Medical History  Diagnosis Date  . Allergy   . Anemia   . Anxiety   . Depression     Past Surgical History  Procedure Laterality Date  . Endometrial ablation      History  Substance Use Topics  . Smoking status: Former Research scientist (life sciences)  . Smokeless tobacco: Not on file  . Alcohol Use: Not on file    Family History  Problem Relation Age of Onset  . Cancer Mother     Hodgkin's Lymphoma, Breast, "skin"   . Cancer Father     Lung, smoker  . Mental illness Sister   . Cancer Maternal Grandmother   . Stroke Paternal Grandmother     No Known Allergies  Medication list has been reviewed and updated.  Current Outpatient Prescriptions on File Prior to Visit  Medication Sig Dispense Refill  . ferrous sulfate (IRON SUPPLEMENT) 325 (65 FE) MG tablet Take 325 mg by mouth daily with breakfast.    . Omega-3 Fatty Acids (FISH OIL) 1000 MG CAPS Take by mouth daily.    Marland Kitchen tolterodine (DETROL LA) 2 MG 24 hr capsule Take 1  capsule (2 mg total) by mouth daily. 30 capsule 5   No current facility-administered medications on file prior to visit.    Review of Systems:  As per HPI- otherwise negative. She has a history of ablation.   Physical Examination: Filed Vitals:   08/06/14 1624  BP: 124/82  Pulse: 85  Temp: 98.8 F (37.1 C)  Resp: 16   Filed Vitals:   08/06/14 1624  Height: 5\' 3"  (1.6 m)  Weight: 170 lb (77.111 kg)   Body mass index is 30.12 kg/(m^2). Ideal Body Weight: Weight in (lb) to have BMI = 25: 140.8  GEN: WDWN, NAD, Non-toxic, A & O x 3, appears to have a bad cold HEENT: Atraumatic, Normocephalic. Neck supple. No masses, No LAD. Bilateral TM wnl, oropharynx normal.  PEERL,EOMI.   Ears and Nose: No external deformity. CV: RRR, No M/G/R. No JVD. No thrill. No extra heart sounds. PULM: CTA B, no wheezes, crackles, rhonchi. No retractions. No resp. distress. No accessory muscle use. ABD: S, NT, ND EXTR: No c/c/e NEURO Normal gait.  PSYCH: Normally interactive. Conversant. Not depressed or anxious appearing.  Calm demeanor.   Results for orders placed or performed in visit on  08/06/14  POCT Influenza A/B  Result Value Ref Range   Influenza A, POC Negative    Influenza B, POC Negative   POCT rapid strep A  Result Value Ref Range   Rapid Strep A Screen Negative Negative  POCT CBC  Result Value Ref Range   WBC 7.8 4.6 - 10.2 K/uL   Lymph, poc 2.1 0.6 - 3.4   POC LYMPH PERCENT 26.7 10 - 50 %L   MID (cbc) 0.7 0 - 0.9   POC MID % 8.7 0 - 12 %M   POC Granulocyte 5.0 2 - 6.9   Granulocyte percent 64.6 37 - 80 %G   RBC 4.66 4.04 - 5.48 M/uL   Hemoglobin 14.33 12.2 - 16.2 g/dL   HCT, POC 42.4 37.7 - 47.9 %   MCV 91.1 80 - 97 fL   MCH, POC 30.6 27 - 31.2 pg   MCHC 33.6 31.8 - 35.4 g/dL   RDW, POC 12.7 %   Platelet Count, POC 304 142 - 424 K/uL   MPV 6.1 0 - 99.8 fL    Assessment and Plan: Cough - Plan: POCT Influenza A/B, POCT rapid strep A, HYDROcodone-homatropine (HYCODAN)  5-1.5 MG/5ML syrup, POCT CBC  Sore throat - Plan: POCT Influenza A/B, POCT rapid strep A, POCT CBC  PND (post-nasal drip) - Plan: ipratropium (ATROVENT) 0.03 % nasal spray, POCT CBC  Viral URI  Treat for likely viral URI with atrovent nasal and hycodan as needed for cough. She will follow-up if not better soon  Signed Lamar Blinks, MD

## 2014-11-18 ENCOUNTER — Telehealth: Payer: Self-pay

## 2014-11-18 NOTE — Telephone Encounter (Signed)
PA started for Tolterodine Tartrate ER

## 2015-05-03 ENCOUNTER — Ambulatory Visit (INDEPENDENT_AMBULATORY_CARE_PROVIDER_SITE_OTHER): Payer: 59 | Admitting: Family Medicine

## 2015-05-03 ENCOUNTER — Ambulatory Visit (INDEPENDENT_AMBULATORY_CARE_PROVIDER_SITE_OTHER): Payer: 59

## 2015-05-03 VITALS — BP 116/70 | HR 89 | Temp 98.5°F | Resp 18 | Ht 63.0 in | Wt 147.0 lb

## 2015-05-03 DIAGNOSIS — M79641 Pain in right hand: Secondary | ICD-10-CM

## 2015-05-03 DIAGNOSIS — F4321 Adjustment disorder with depressed mood: Secondary | ICD-10-CM | POA: Diagnosis not present

## 2015-05-03 MED ORDER — FLUOXETINE HCL 20 MG PO TABS: 20.0000 mg | ORAL_TABLET | Freq: Every day | ORAL | Status: DC

## 2015-05-03 NOTE — Progress Notes (Signed)
[facility] [address] [place] [address] [phone]  Date:  05/03/2015   Name:  Heather Rocha   DOB:  1966/06/01   MRN:  [id number]  PCP:  Lamar Blinks, MD    Chief Complaint: Follow-up and Hand Pain   History of Present Illness:  Heather Rocha is a 49 y.o. very pleasant female patient who presents with the following:  Here today to follow-up mood problems.   She started a new job in 2023/01/17, then her mother passed away.  Her new job is good but much more stressful, and she finds herself getting upset and crying too much. This is getting her in trouble at work.  She is having a harder time focusing and getting things together She finds that her eating, sleeping, and mood all seem to have changed since her mother passed away Her job has been understanding, but she is not getting better as fast as she thinks she should be.   Her mom was a long time cancer survivor, and had a recurrence.  She did not really share her increaisng illness with Heather Rocha (she kept it to herself) so she was not aware that she was so sick. She then was in the ICU in [place] and passed away  She has tried some grief counseling.  However this group setting seems to make her more upset- she does not think she is ready for this yet She notes that she has now developed some nervous ticks, and she is not sleeping well She is not eating much- she has lost weight  She has suffered some from depression over the years, but was doing ok prior to her mother dying.  Admits that she got some sort of unknown ADHD medication from a friend and has been taking some of it- this seemed to help her focus, gave her more energy at work.  Wants to know if she can have some ADHD medication  Also notes some pain in her right ring finger- more when she has to lift heavy objects at her job. She has been trying to avoid lifting and it has improved  She is not aware of any acute injury, and has not  fractured it in the past.  It is stiff and may swell if she overused the finger  Denies any SI- "I would never do that."    Patient Active Problem List   Diagnosis Date Noted  . MOLE 09/04/2007  . DEPRESSION 09/04/2007  . IRREGULAR MENSTRUAL CYCLE 09/04/2007    Past Medical History  Diagnosis Date  . Allergy   . Anemia   . Anxiety   . Depression     Past Surgical History  Procedure Laterality Date  . Endometrial ablation      History  Substance Use Topics  . Smoking status: Current Every Day Smoker -- 0.50 packs/day    Types: Cigarettes  . Smokeless tobacco: Not on file  . Alcohol Use: 0.0 oz/week    0 Standard drinks or equivalent per week    Family History  Problem Relation Age of Onset  . Cancer Mother     Hodgkin's Lymphoma, Breast, "skin"   . Cancer Father     Lung, smoker  . Mental illness Sister   . Cancer Maternal Grandmother   . Stroke Paternal Grandmother     No Known Allergies  Medication list has been reviewed and updated.  Current Outpatient Prescriptions on File Prior to Visit  Medication Sig  Dispense Refill  . ferrous sulfate (IRON SUPPLEMENT) 325 (65 FE) MG tablet Take 325 mg by mouth daily with breakfast.    . HYDROcodone-homatropine (HYCODAN) 5-1.5 MG/5ML syrup Take 5 mLs by mouth every 8 (eight) hours as needed for cough. 90 mL 0  . ipratropium (ATROVENT) 0.03 % nasal spray Place 2 sprays into the nose 4 (four) times daily. 30 mL 6  . Omega-3 Fatty Acids (FISH OIL) 1000 MG CAPS Take by mouth daily.    Marland Kitchen tolterodine (DETROL LA) 2 MG 24 hr capsule Take 1 capsule (2 mg total) by mouth daily. 30 capsule 5   No current facility-administered medications on file prior to visit.    Review of Systems:  As per HPI- otherwise negative.   Physical Examination: Filed Vitals:   05/03/15 1325  BP: 116/70  Pulse: 89  Temp: 98.5 F (36.9 C)  Resp: 18   Filed Vitals:   05/03/15 1325  Height: 5\' 3"  (1.6 m)  Weight: 147 lb (66.679 kg)    Body mass index is 26.05 kg/(m^2). Ideal Body Weight: Weight in (lb) to have BMI = 25: 140.8  GEN: WDWN, NAD, Non-toxic, A & O x 3, mild overweight, tearful HEENT: Atraumatic, Normocephalic. Neck supple. No masses, No LAD. Ears and Nose: No external deformity. CV: RRR, No M/G/R. No JVD. No thrill. No extra heart sounds. PULM: CTA B, no wheezes, crackles, rhonchi. No retractions. No resp. distress. No accessory muscle use. EXTR: No c/c/e NEURO Normal gait.  PSYCH: Normally interactive. Conversant. Not depressed or anxious appearing.  Calm demeanor.  Right hand: she has tenderness at the DIP joint, notes a little bit of stiffness here. However her ROM is normal and she does not have any redness or swelling  [facility] reading (PRIMARY) by  Dr. Lorelei Pont. Right hand: negative  RIGHT HAND - COMPLETE 3+ VIEW  COMPARISON: None in PACs  FINDINGS: The bones of the right hand are adequately mineralized. There is no lytic or blastic lesion or periosteal reaction. The joint spaces are reasonably well-maintained. The soft tissues are unremarkable.  IMPRESSION: There is no acute or significant chronic bony abnormality of the right fifth finger.  Assessment and Plan: Grief reaction - Plan: FLUoxetine (PROZAC) 20 MG tablet  Right hand pain - Plan: DG Hand Complete Right  Start on prozac for depression- explained that ADHD medication are not appropriate in this situation, as these medications are only for ADHD.  She understands.  She does not want anything for sleep Encouraged her to take care of herself, to try the medication for depression, and to let me know how she is doing in the next couple of weeks  Offered to refer to hand therapy for her right hand.  Suspect she has overuse of the finger but do not see any more significant injury.  She prefers to keep an eye on this, will rest her finger and let me know if not better  Signed Lamar Blinks, MD

## 2015-05-03 NOTE — Patient Instructions (Signed)
We are going to start you on prozac for your depression Start with 20 mg a day, and increase to 40 after 2 weeks Focus on taking care of yourself by eating, sleeping, and exercising.   Also try to spend some time having fun and taking care of yourself!  I hope that you feel better soon- please let me know how you are doing over the next 1-2 weeks

## 2015-10-21 ENCOUNTER — Encounter: Payer: Self-pay | Admitting: Family Medicine

## 2015-10-26 ENCOUNTER — Encounter: Payer: Self-pay | Admitting: Family Medicine

## 2015-11-22 IMAGING — US US CHEST/MEDIASTINUM
1 series · 11 of 11 positions shown · non-contrast
Comparison: None.

CLINICAL DATA: Palpable abnormality in the left flank for 3-4
months

EXAM:
CHEST ULTRASOUND

[Series 1: us chest/mediastinum · 0.08mm/px · 11 acquisitions, 11 frames shown]
[im 1/11]
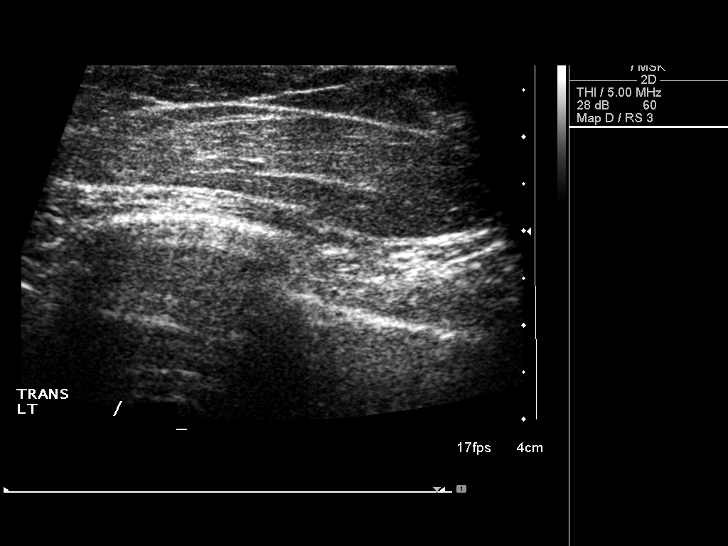
[im 2/11]
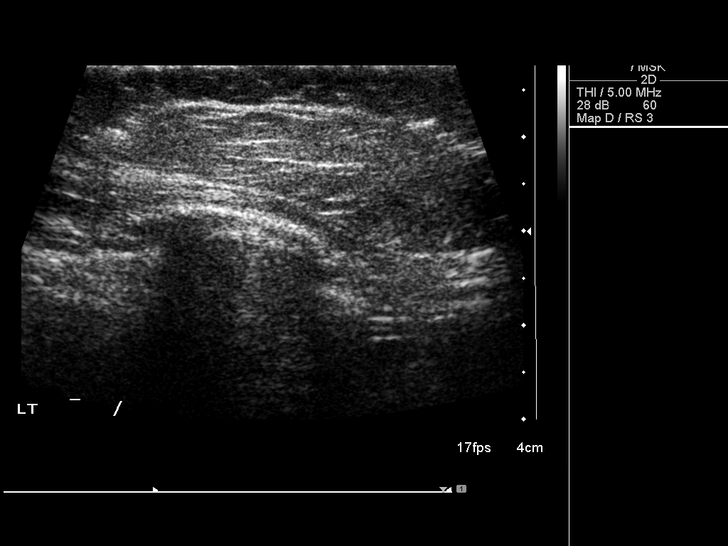
[im 3/11]
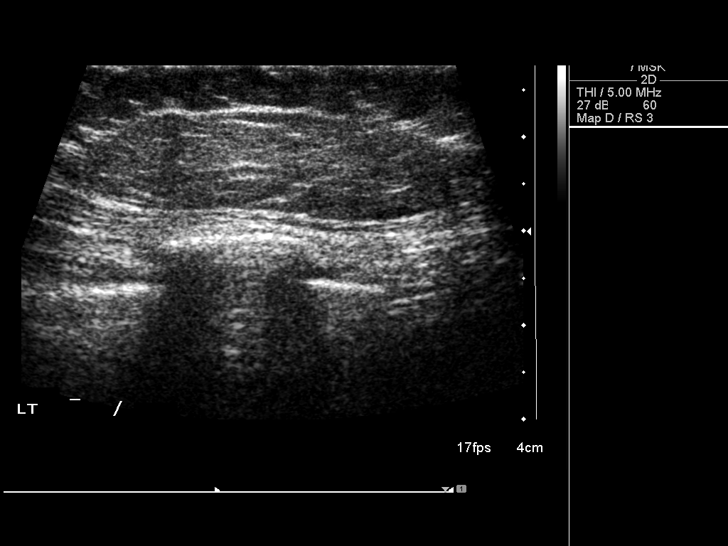
[im 4/11]
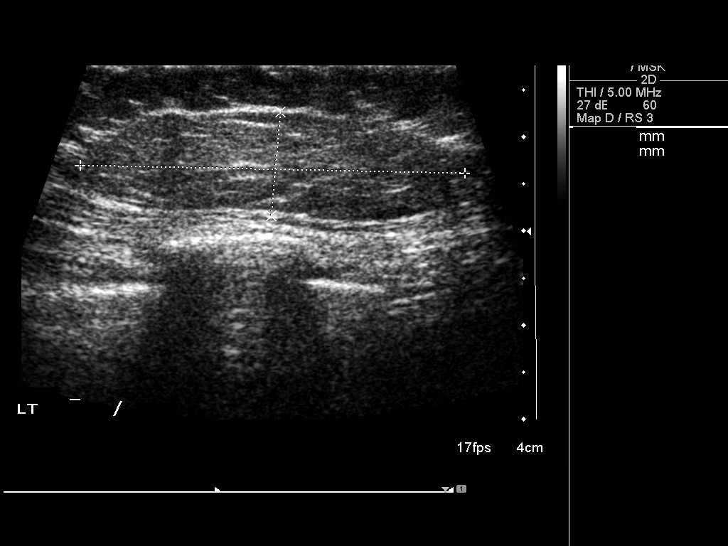
[im 5/11]
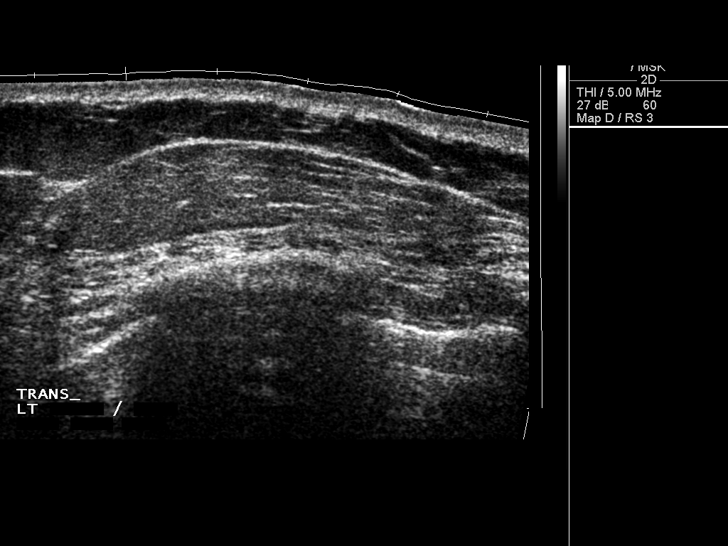
[im 6/11]
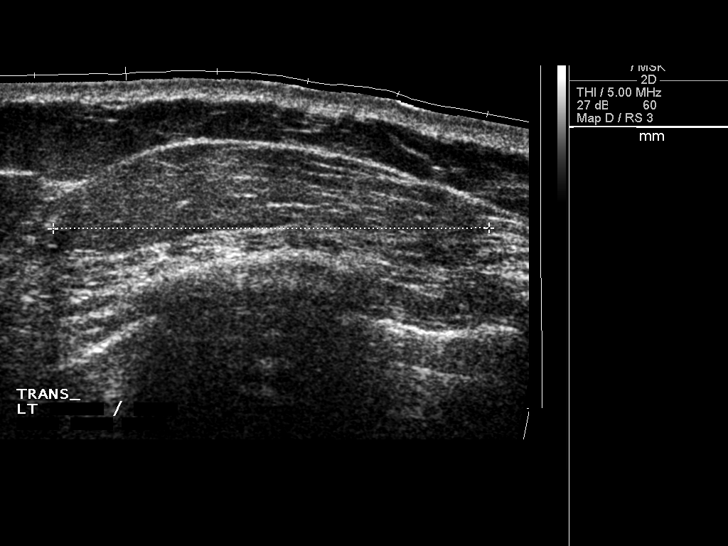
[im 7/11]
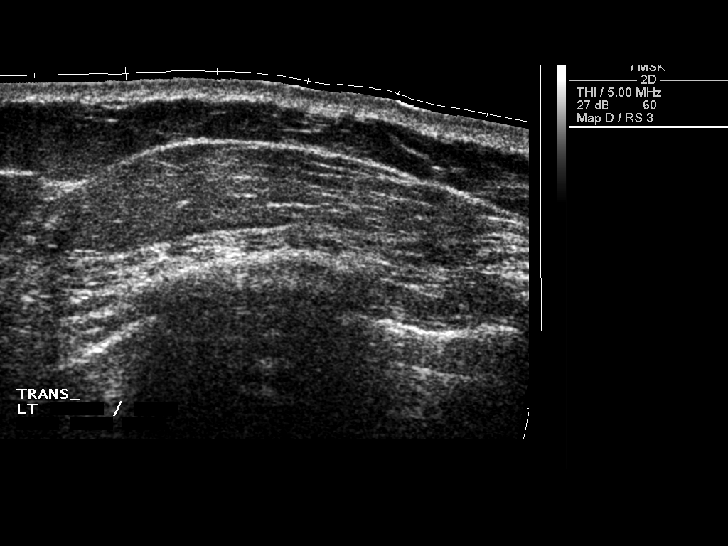
[im 8/11]
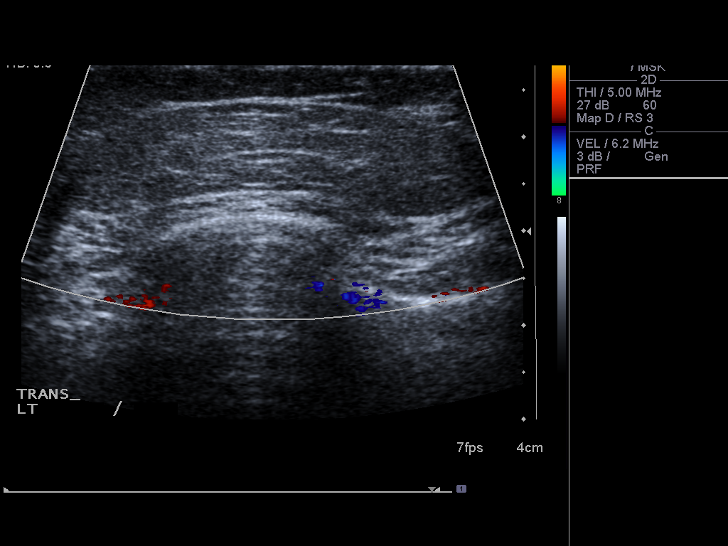
[im 9/11]
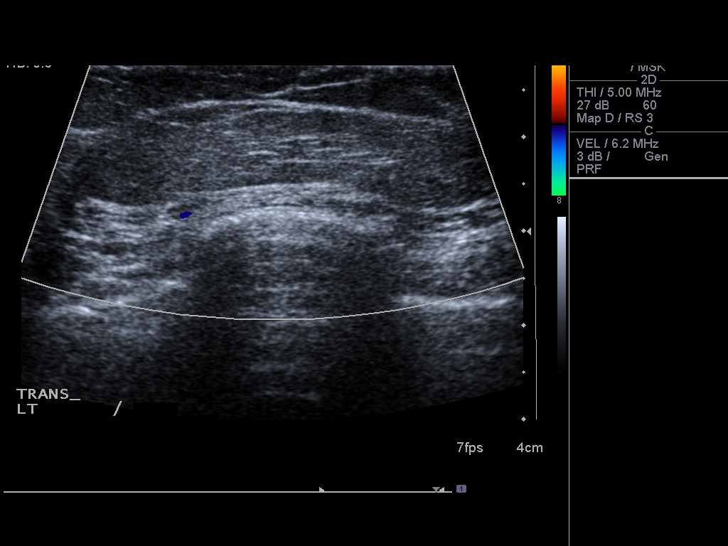
[im 10/11]
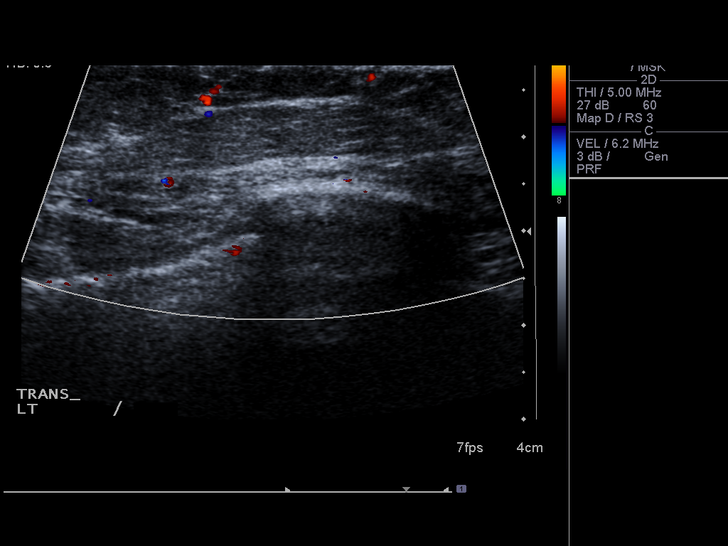
[im 11/11]
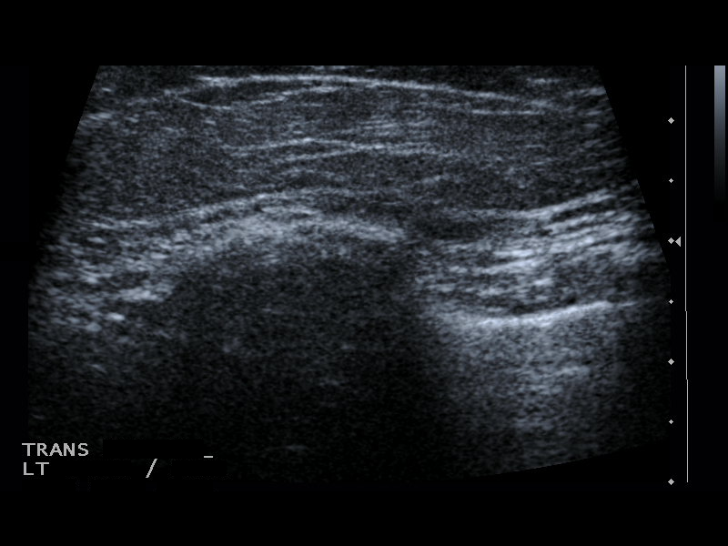

[11 of 11 positions shown; findings below may reference images not displayed]

FINDINGS: Ultrasound over the palpable abnormality in the left flank was
performed. At that site there is an oval soft tissue structure
measuring 4.1 x 1.1 x 4.8 cm with no internal blood flow. By
ultrasound this is most consistent with a lipoma. Also on clinical
exam, this is soft and pliable, again most consistent with lipoma.
IMPRESSION: The palpable abnormality in the left flank is consistent with a
lipoma by ultrasound.

## 2017-08-15 ENCOUNTER — Other Ambulatory Visit: Payer: Self-pay | Admitting: Nurse Practitioner

## 2017-08-15 ENCOUNTER — Other Ambulatory Visit (HOSPITAL_COMMUNITY)
Admission: RE | Admit: 2017-08-15 | Discharge: 2017-08-15 | Disposition: A | Discharge status: Home / Self Care | Payer: Managed Care, Other (non HMO) | Source: Ambulatory Visit | Attending: Nurse Practitioner | Admitting: Nurse Practitioner

## 2017-08-15 DIAGNOSIS — Z01419 Encounter for gynecological examination (general) (routine) without abnormal findings: Secondary | ICD-10-CM | POA: Insufficient documentation

## 2017-08-15 DIAGNOSIS — Z1231 Encounter for screening mammogram for malignant neoplasm of breast: Secondary | ICD-10-CM

## 2017-08-16 LAB — CYTOLOGY - PAP
Diagnosis: NEGATIVE
HPV: NOT DETECTED

## 2017-09-17 ENCOUNTER — Ambulatory Visit: Payer: Managed Care, Other (non HMO)

## 2017-10-22 ENCOUNTER — Ambulatory Visit: Payer: Managed Care, Other (non HMO)

## 2018-02-26 ENCOUNTER — Other Ambulatory Visit: Payer: Self-pay | Admitting: Family Medicine

## 2018-02-26 DIAGNOSIS — Z1231 Encounter for screening mammogram for malignant neoplasm of breast: Secondary | ICD-10-CM

## 2018-03-25 ENCOUNTER — Ambulatory Visit: Payer: Managed Care, Other (non HMO)

## 2018-04-17 ENCOUNTER — Ambulatory Visit: Payer: Managed Care, Other (non HMO)

## 2018-05-02 ENCOUNTER — Ambulatory Visit
Admission: RE | Admit: 2018-05-02 | Discharge: 2018-05-02 | Disposition: A | Discharge status: Home / Self Care | Payer: Managed Care, Other (non HMO) | Source: Ambulatory Visit | Attending: Family Medicine | Admitting: Family Medicine

## 2018-05-02 DIAGNOSIS — Z1231 Encounter for screening mammogram for malignant neoplasm of breast: Secondary | ICD-10-CM

## 2018-06-27 ENCOUNTER — Ambulatory Visit (HOSPITAL_COMMUNITY)
Admission: EM | Admit: 2018-06-27 | Discharge: 2018-06-27 | Disposition: A | Discharge status: Home / Self Care | Payer: Managed Care, Other (non HMO) | Attending: Internal Medicine | Admitting: Internal Medicine

## 2018-06-27 ENCOUNTER — Encounter (HOSPITAL_COMMUNITY): Payer: Self-pay

## 2018-06-27 DIAGNOSIS — L03115 Cellulitis of right lower limb: Secondary | ICD-10-CM

## 2018-06-27 DIAGNOSIS — R5383 Other fatigue: Secondary | ICD-10-CM

## 2018-06-27 MED ORDER — ONDANSETRON 4 MG PO TBDP
4.0000 mg | ORAL_TABLET | Freq: Once | ORAL | Status: AC
  Administered 2018-06-27: 4 mg via ORAL

## 2018-06-27 MED ORDER — DOXYCYCLINE HYCLATE 100 MG PO CAPS: 100.0000 mg | ORAL_CAPSULE | Freq: Two times a day (BID) | ORAL | 0 refills | Status: DC

## 2018-06-27 MED ORDER — KETOROLAC TROMETHAMINE 60 MG/2ML IM SOLN
INTRAMUSCULAR | Status: AC
  Filled 2018-06-27: qty 2

## 2018-06-27 MED ORDER — ONDANSETRON 4 MG PO TBDP
ORAL_TABLET | ORAL | Status: AC
  Filled 2018-06-27: qty 1

## 2018-06-27 MED ORDER — KETOROLAC TROMETHAMINE 60 MG/2ML IM SOLN
60.0000 mg | Freq: Once | INTRAMUSCULAR | Status: AC
  Administered 2018-06-27: 60 mg via INTRAMUSCULAR

## 2018-06-27 MED ORDER — ONDANSETRON HCL 4 MG PO TABS: 8.0000 mg | ORAL_TABLET | ORAL | 0 refills | Status: DC | PRN

## 2018-06-27 NOTE — Discharge Instructions (Addendum)
Symptoms today (worse headache, dizziness/fatigue) seem most likely due to exhaustion.  Injection of ketorolac (anti inflammatory/pain reliever) was given at the urgent care for pain.  Note for work given for tomorrow and Sunday.  Rest and push fluids.  Nausea may be related to taking bactrim.  Will stop bactrim and change to doxycycline; prescription for doxycycline sent to the pharmacy.  Prescription for ondansetron, for nausea, sent also.  Recheck or followup with your primary care provider if not improving after some rest, as expected.

## 2018-06-27 NOTE — ED Provider Notes (Signed)
[place]    CSN: [id number] Arrival date & time: 06/27/18  1612     History   Chief Complaint Chief Complaint  Patient presents with  . Insect Bite  . Headache  . Dizziness  . Fatigue  . Nausea    HPI Heather Rocha is a 52 y.o. female.   Patient reports that for the last 12 weeks she has been working 4 days in a row and then driving to [place] for 3 days to help care for her father who just had a colostomy, and then coming back and working 4 days and then driving back to [place] for 3 days.  She was seen at a clinic in [place] 2 days ago, for an infected area in her right lower leg, and started Bactrim for this yesterday morning.  She has headaches pretty often, and reports that today her headache is worse than usual, with some nausea.  She feels off balance.  She is very tired.  The area of redness and the amount of discomfort in the right lower leg have both improved quite a bit, after 3 doses of Bactrim.  She drove herself to the urgent care and was able to walk into the facility independently.  She is currently sitting in a push chair.    HPI  Past Medical History:  Diagnosis Date  . Allergy   . Anemia   . Anxiety   . Depression     Patient Active Problem List   Diagnosis Date Noted  . MOLE 09/04/2007  . DEPRESSION 09/04/2007  . IRREGULAR MENSTRUAL CYCLE 09/04/2007    Past Surgical History:  Procedure Laterality Date  . ENDOMETRIAL ABLATION        Home Medications    Prior to Admission medications   Medication Sig Start Date End Date Taking? Authorizing Provider  cholecalciferol (VITAMIN D) 1000 UNITS tablet Take 2,000 Units by mouth daily.    [provider]  Coconut Oil OIL 1,000 mg by Does not apply route.    [provider]  doxycycline (VIBRAMYCIN) 100 MG capsule Take 1 capsule (100 mg total) by mouth 2 (two) times daily. 06/27/18   Wynona Luna, MD  ferrous sulfate (IRON SUPPLEMENT) 325 (65 FE) MG tablet  Take 325 mg by mouth daily with breakfast.    [provider]  FLUoxetine (PROZAC) 20 MG tablet Take 1 tablet (20 mg total) by mouth daily. Increase to 40 mg after 2 weeks 05/03/15   Copland, Gay Filler, MD  Multiple Vitamins-Minerals (MULTIVITAMIN ADULT PO) Take by mouth.    [provider]  Omega-3 Fatty Acids (FISH OIL) 1000 MG CAPS Take by mouth daily.    [provider]  ondansetron (ZOFRAN) 4 MG tablet Take 2 tablets (8 mg total) by mouth every 4 (four) hours as needed for nausea or vomiting. 06/27/18   Wynona Luna, MD  tolterodine (DETROL LA) 2 MG 24 hr capsule Take 1 capsule (2 mg total) by mouth daily. Patient not taking: Reported on 05/03/2015 04/12/14   Copland, Gay Filler, MD  vitamin B-12 (CYANOCOBALAMIN) 500 MCG tablet Take 1,000 mcg by mouth daily.    [provider]    Family History Family History  Problem Relation Age of Onset  . Cancer Father        Lung, smoker  . Cancer Mother        Hodgkin's Lymphoma, Breast, "skin"   . Mental illness Sister   . Cancer Maternal Grandmother   .  Stroke Paternal Grandmother     Social History Social History   Tobacco Use  . Smoking status: Current Every Day Smoker    Packs/day: 0.50    Types: Cigarettes  Substance Use Topics  . Alcohol use: Yes    Alcohol/week: 0.0 standard drinks  . Drug use: No     Allergies   Patient has no known allergies.   Review of Systems Review of Systems  All other systems reviewed and are negative.    Physical Exam Triage Vital Signs ED Triage Vitals [06/27/18 1731]  Enc Vitals Group     BP 118/84     Pulse Rate 74     Resp 20     Temp (!) 97.4 F (36.3 C)     Temp Source Oral     SpO2 99 %     Weight      Height      Pain Score      Pain Loc    Orthostatic VS for the past 24 hrs:  BP- Lying Pulse- Lying BP- Sitting Pulse- Sitting BP- Standing at 0 minutes Pulse- Standing at 0 minutes  06/27/18 1810 115/78 66 117/84 62 116/83 70     Updated Vital Signs BP 118/84 (BP Location: Left Arm)   Pulse 74   Temp (!) 97.4 F (36.3 C) (Oral)   Resp 20   SpO2 99%  Physical Exam  Constitutional: She is oriented to person, place, and time. No distress.  HENT:  Head: Atraumatic.  Eyes:  Conjugate gaze observed, no eye redness/discharge  Neck: Neck supple.  Cardiovascular: Normal rate and regular rhythm.  Pulmonary/Chest: No respiratory distress. She has no wheezes. She has no rales.  Lungs clear, symmetric breath sounds   Abdominal: She exhibits no distension.  Musculoskeletal: Normal range of motion.  Neurological: She is alert and oriented to person, place, and time.  Face is symmetric, speech is clear/coherent  Skin: Skin is warm and dry.  2.5 area of dark erythema in the lower right leg, with a 4 inch zone of paler erythema surrounding.  This is inside the rim drawn on the skin with a marker by about an inch.  The area that is darker red is somewhat tender to palpation but without underlying fluctuance.  Nursing note and vitals reviewed.    UC Treatments / Results   Procedures Procedures (including critical care time)  Medications Ordered in UC Medications  ketorolac (TORADOL) injection 60 mg (60 mg Intramuscular Given 06/27/18 1851)  ondansetron (ZOFRAN-ODT) disintegrating tablet 4 mg (4 mg Oral Given 06/27/18 1851)    Final Clinical Impressions(s) / UC Diagnoses   Final diagnoses:  Exhaustion  Cellulitis of right lower extremity     Discharge Instructions     Symptoms today (worse headache, dizziness/fatigue) seem most likely due to exhaustion.  Injection of ketorolac (anti inflammatory/pain reliever) was given at the urgent care for pain.  Note for work given for tomorrow and Sunday.  Rest and push fluids.  Nausea may be related to taking bactrim.  Will stop bactrim and change to doxycycline; prescription for doxycycline sent to the pharmacy.  Prescription for ondansetron, for nausea, sent also.   Recheck or followup with your primary care provider if not improving after some rest, as expected.       ED Prescriptions    Medication Sig Dispense Auth. Provider   doxycycline (VIBRAMYCIN) 100 MG capsule Take 1 capsule (100 mg total) by mouth 2 (two) times daily. 20 capsule Valere Dross,  Jadene Pierini, MD   ondansetron (ZOFRAN) 4 MG tablet Take 2 tablets (8 mg total) by mouth every 4 (four) hours as needed for nausea or vomiting. 20 tablet Wynona Luna, MD        Wynona Luna, MD 06/30/18 2149

## 2018-06-27 NOTE — ED Triage Notes (Signed)
Pt presents with spider bite on right leg that she is currently on bactrim for but she feels like it is getting worse; Pt states she has been having really bad headaches, dizziness, fatigue and nausea.

## 2019-03-24 ENCOUNTER — Other Ambulatory Visit: Payer: Self-pay

## 2019-03-24 ENCOUNTER — Ambulatory Visit (INDEPENDENT_AMBULATORY_CARE_PROVIDER_SITE_OTHER): Payer: 59

## 2019-03-24 ENCOUNTER — Encounter: Payer: Self-pay | Admitting: Podiatry

## 2019-03-24 ENCOUNTER — Ambulatory Visit (INDEPENDENT_AMBULATORY_CARE_PROVIDER_SITE_OTHER): Payer: 59 | Admitting: Podiatry

## 2019-03-24 VITALS — BP 117/78 | HR 71 | Temp 98.2°F | Resp 16

## 2019-03-24 DIAGNOSIS — M722 Plantar fascial fibromatosis: Secondary | ICD-10-CM

## 2019-03-24 MED ORDER — METHYLPREDNISOLONE 4 MG PO TBPK: ORAL_TABLET | ORAL | 0 refills | Status: DC

## 2019-03-24 NOTE — Patient Instructions (Signed)

## 2019-03-25 NOTE — Progress Notes (Signed)
  Subjective:  Patient ID: Heather Rocha, female    DOB: Sep 12, 1966,  MRN: [id number] HPI Chief Complaint  Patient presents with  . Foot Pain    Plantar heel bilateral - aching x several months, AM pain, some lateral side pain, tried new shoes, ice, OTC meds, massages-no help, PCP rx'd meloxicam  . New Patient (Initial Visit)    53 y.o. female presents with the above complaint.   ROS: Denies fever chills nausea vomiting muscle aches and pains.  Past Medical History:  Diagnosis Date  . Allergy   . Anemia   . Anxiety   . Depression    Past Surgical History:  Procedure Laterality Date  . ENDOMETRIAL ABLATION      Current Outpatient Medications:  Marland Kitchen  MELATONIN PO, Take by mouth., Disp: , Rfl:  .  meloxicam (MOBIC) 15 MG tablet, TAKE 1 TABLET BY MOUTH ONCE DAILY WITH FOOD FOR 2 WEEKS THEN AS NEEDED, Disp: , Rfl:  .  methylPREDNISolone (MEDROL DOSEPAK) 4 MG TBPK tablet, 6 day dose pack - take as directed, Disp: 21 tablet, Rfl: 0 .  Multiple Vitamins-Minerals (MULTIVITAMIN ADULT PO), Take by mouth., Disp: , Rfl:  .  vitamin B-12 (CYANOCOBALAMIN) 500 MCG tablet, Take 1,000 mcg by mouth daily., Disp: , Rfl:   Allergies  Allergen Reactions  . Bactrim [Sulfamethoxazole-Trimethoprim]     "Disoriented"   Review of Systems Objective:   Vitals:   03/24/19 0908  BP: 117/78  Pulse: 71  Resp: 16  Temp: 98.2 F (36.8 C)    General: Well developed, nourished, in no acute distress, alert and oriented x3   Dermatological: Skin is warm, dry and supple bilateral. Nails x 10 are well maintained; remaining integument appears unremarkable at this time. There are no open sores, no preulcerative lesions, no rash or signs of infection present.  Vascular: Dorsalis Pedis artery and Posterior Tibial artery pedal pulses are 2/4 bilateral with immedate capillary fill time. Pedal hair growth present. No varicosities and no lower extremity edema present bilateral.   Neruologic: Grossly intact  via light touch bilateral. Vibratory intact via tuning fork bilateral. Protective threshold with Semmes Wienstein monofilament intact to all pedal sites bilateral. Patellar and Achilles deep tendon reflexes 2+ bilateral. No Babinski or clonus noted bilateral.   Musculoskeletal: No gross boney pedal deformities bilateral. No pain, crepitus, or limitation noted with foot and ankle range of motion bilateral. Muscular strength 5/5 in all groups tested bilateral.  Gait: Unassisted, Nonantalgic.    Radiographs:  Radiographs taken today demonstrate a small plantar distal aortic calcaneal heel spur with soft tissue increase in density at the plantar fascial calcaneal insertion sites bilateral.  Assessment & Plan:   Assessment: Plantar fasciitis bilateral.   Plan: Discussed etiology pathology conservative versus surgical therapies injected her bilateral heels 20 mg Kenalog 5 mg Marcaine start her on Medrol Dosepak to be followed by meloxicam.  Put her in a plantar fascial brace and a night splint bilateral.  She was also's candidate for orthotics today.     Pierce Barocio T. [place], [place]

## 2019-04-21 ENCOUNTER — Other Ambulatory Visit: Payer: Self-pay

## 2019-04-21 ENCOUNTER — Ambulatory Visit: Payer: No Typology Code available for payment source | Admitting: Podiatry

## 2019-04-21 ENCOUNTER — Encounter: Payer: 59 | Admitting: Orthotics

## 2019-04-21 ENCOUNTER — Encounter: Payer: Self-pay | Admitting: Podiatry

## 2019-04-21 VITALS — Temp 98.7°F

## 2019-04-21 DIAGNOSIS — M778 Other enthesopathies, not elsewhere classified: Secondary | ICD-10-CM

## 2019-04-21 DIAGNOSIS — M722 Plantar fascial fibromatosis: Secondary | ICD-10-CM

## 2019-04-21 DIAGNOSIS — M779 Enthesopathy, unspecified: Secondary | ICD-10-CM

## 2019-04-21 NOTE — Progress Notes (Signed)
She presents today to pick up her orthotics and is complaining of pain overlying the fourth fifth tarsometatarsal joints.  States that she is doing much better in her heels medially.  Objective: Pulses are palpable.  She has no pain on palpation medial calcaneal tubercles bilateral heels though she does have pain on direct palpation of the fourth fifth TMT.  Both feet are similar magnitude.  Assessment: Resolving capsulitis resulting and lateral compensation fourth fifth TMT joint capsulitis.  Plan: Picked up her orthotics today was given both overload and home-going instructions follow-up by Liliane Channel and then was also injected at the level of the metatarsal phalangeal joint.  Injected her with 20 mg Kenalog 5 mg Marcaine point maximal tenderness bilateral dorsal lateral foot.

## 2019-05-26 ENCOUNTER — Ambulatory Visit: Payer: No Typology Code available for payment source | Admitting: Podiatry

## 2019-09-15 ENCOUNTER — Ambulatory Visit (INDEPENDENT_AMBULATORY_CARE_PROVIDER_SITE_OTHER): Payer: No Typology Code available for payment source | Admitting: Podiatry

## 2019-09-15 ENCOUNTER — Other Ambulatory Visit: Payer: Self-pay

## 2019-09-15 ENCOUNTER — Encounter: Payer: Self-pay | Admitting: Podiatry

## 2019-09-15 DIAGNOSIS — M778 Other enthesopathies, not elsewhere classified: Secondary | ICD-10-CM

## 2019-09-15 DIAGNOSIS — M722 Plantar fascial fibromatosis: Secondary | ICD-10-CM

## 2019-09-15 MED ORDER — MELOXICAM 15 MG PO TABS: 15.0000 mg | ORAL_TABLET | Freq: Every day | ORAL | 3 refills | Status: AC

## 2019-09-16 NOTE — Progress Notes (Signed)
She presents today states that she is having pain again as she refers to the plantar fasciitis bilaterally.  States that she needs a refill on her meloxicam which seem to always be helpful.  She continues to wear her inserts every day she refers to her orthotics.  She states that her shoes are getting a little worn and she may need a new pair.  Objective: Vital signs are stable alert and oriented x3.  Pulses are palpable.  She has pain on palpation medial calcaneal tubercles bilateral.  No erythema edema cellulitis drainage or odor.  Assessment: Chronic intractable plantar fasciitis.  Plan: After sterile Betadine skin prep I injected the bilateral heels today 20 mg Kenalog 5 mg Marcaine point of maximal tenderness proximal medial heels.  Provided her with another prescription for meloxicam.  Follow-up with her in 1 month.

## 2019-10-27 ENCOUNTER — Encounter (HOSPITAL_COMMUNITY): Payer: Self-pay | Admitting: *Deleted

## 2019-10-27 ENCOUNTER — Other Ambulatory Visit: Payer: Self-pay

## 2019-10-27 ENCOUNTER — Emergency Department (HOSPITAL_COMMUNITY)
Admission: EM | Admit: 2019-10-27 | Discharge: 2019-10-27 | Disposition: A | Discharge status: Home / Self Care | Payer: No Typology Code available for payment source | Attending: Emergency Medicine | Admitting: Emergency Medicine

## 2019-10-27 DIAGNOSIS — Z203 Contact with and (suspected) exposure to rabies: Secondary | ICD-10-CM | POA: Diagnosis not present

## 2019-10-27 DIAGNOSIS — Z2914 Encounter for prophylactic rabies immune globin: Secondary | ICD-10-CM | POA: Insufficient documentation

## 2019-10-27 DIAGNOSIS — T148XXA Other injury of unspecified body region, initial encounter: Secondary | ICD-10-CM

## 2019-10-27 DIAGNOSIS — Z79899 Other long term (current) drug therapy: Secondary | ICD-10-CM | POA: Diagnosis not present

## 2019-10-27 DIAGNOSIS — Z23 Encounter for immunization: Secondary | ICD-10-CM | POA: Diagnosis not present

## 2019-10-27 DIAGNOSIS — Y92008 Other place in unspecified non-institutional (private) residence as the place of occurrence of the external cause: Secondary | ICD-10-CM | POA: Diagnosis not present

## 2019-10-27 DIAGNOSIS — Y93E9 Activity, other interior property and clothing maintenance: Secondary | ICD-10-CM | POA: Insufficient documentation

## 2019-10-27 DIAGNOSIS — Y999 Unspecified external cause status: Secondary | ICD-10-CM | POA: Insufficient documentation

## 2019-10-27 DIAGNOSIS — Z87891 Personal history of nicotine dependence: Secondary | ICD-10-CM | POA: Insufficient documentation

## 2019-10-27 DIAGNOSIS — Z209 Contact with and (suspected) exposure to unspecified communicable disease: Secondary | ICD-10-CM

## 2019-10-27 DIAGNOSIS — S71152A Open bite, left thigh, initial encounter: Secondary | ICD-10-CM | POA: Diagnosis present

## 2019-10-27 DIAGNOSIS — W5589XA Other contact with other mammals, initial encounter: Secondary | ICD-10-CM | POA: Diagnosis not present

## 2019-10-27 MED ORDER — AMOXICILLIN-POT CLAVULANATE 875-125 MG PO TABS
1.0000 | ORAL_TABLET | Freq: Once | ORAL | Status: AC
  Administered 2019-10-27: 1 via ORAL
  Filled 2019-10-27: qty 1

## 2019-10-27 MED ORDER — RABIES IMMUNE GLOBULIN 150 UNIT/ML IM INJ
20.0000 [IU]/kg | INJECTION | Freq: Once | INTRAMUSCULAR | Status: AC
  Administered 2019-10-27: 1650 [IU] via INTRAMUSCULAR
  Filled 2019-10-27: qty 11

## 2019-10-27 MED ORDER — AMOXICILLIN-POT CLAVULANATE 875-125 MG PO TABS: 1.0000 | ORAL_TABLET | Freq: Two times a day (BID) | ORAL | 0 refills | Status: AC

## 2019-10-27 MED ORDER — RABIES VACCINE, PCEC IM SUSR
1.0000 mL | Freq: Once | INTRAMUSCULAR | Status: AC
  Administered 2019-10-27: 1 mL via INTRAMUSCULAR
  Filled 2019-10-27: qty 1

## 2019-10-27 MED ORDER — TETANUS-DIPHTH-ACELL PERTUSSIS 5-2.5-18.5 LF-MCG/0.5 IM SUSP
0.5000 mL | Freq: Once | INTRAMUSCULAR | Status: AC
  Administered 2019-10-27: 0.5 mL via INTRAMUSCULAR
  Filled 2019-10-27: qty 0.5

## 2019-10-27 NOTE — ED Triage Notes (Signed)
Patient reports seeing a bat attached to her leg this morning when she was moving boxes from a storage unit.  Patient reports bat had to be physically removed from her leg by friend.

## 2019-10-27 NOTE — ED Provider Notes (Signed)
[facility] DEPT Provider Note   CSN: [id number] Arrival date & time: 10/27/19  1155     History Chief Complaint  Patient presents with  . Animal Bite    Heather Rocha is a 54 y.o. female.  HPI  Patient is a 54 year old female with a history of anxiety, anemia, depression presented today after presumed bat bite that occurred at 9 AM this morning.  Patient states that Heather Rocha was in the storage space of her father as Heather Rocha is currently moving to [place] when Heather Rocha sat down and noticed that the weaning of it that was curled around her leg.  Heather Rocha realized that Heather Rocha was sitting on the bed and jumped up and ran away.  Heather Rocha states that Heather Rocha was sitting on the bed for several minutes and is uncertain whether Heather Rocha got bit or not.  Patient states that the young child Heather Rocha was bit by a dog and received 1 shot in her belly but is uncertain of any vaccination.  Patient states Heather Rocha is not up-to-date on her tetanus.  Patient denies any symptoms.  Specifically Heather Rocha denies any nausea, vomiting, headache, dizziness, fevers or chills, abdominal pain, night sweats, bleeding, bruising or confusion.     Past Medical History:  Diagnosis Date  . Allergy   . Anemia   . Anxiety   . Depression     Patient Active Problem List   Diagnosis Date Noted  . MOLE 09/04/2007  . DEPRESSION 09/04/2007  . IRREGULAR MENSTRUAL CYCLE 09/04/2007    Past Surgical History:  Procedure Laterality Date  . ENDOMETRIAL ABLATION       OB History   No obstetric history on file.     Family History  Problem Relation Age of Onset  . Cancer Father        Lung, smoker  . Cancer Mother        Hodgkin's Lymphoma, Breast, "skin"   . Mental illness Sister   . Cancer Maternal Grandmother   . Stroke Paternal Grandmother     Social History   Tobacco Use  . Smoking status: Former Smoker    Packs/day: 0.50    Types: Cigarettes    Start date: 2016  . Smokeless tobacco: Never Used  Substance  Use Topics  . Alcohol use: Yes    Alcohol/week: 0.0 standard drinks  . Drug use: No    Home Medications Prior to Admission medications   Medication Sig Start Date End Date Taking? Authorizing Provider  amoxicillin-clavulanate (AUGMENTIN) 875-125 MG tablet Take 1 tablet by mouth every 12 (twelve) hours. 10/27/19   Tedd Sias, PA  MELATONIN PO Take by mouth.    [provider]  meloxicam (MOBIC) 15 MG tablet Take 1 tablet (15 mg total) by mouth daily. 09/15/19   Hyatt, Max T, DPM  Multiple Vitamins-Minerals (MULTIVITAMIN ADULT PO) Take by mouth.    [provider]  vitamin B-12 (CYANOCOBALAMIN) 500 MCG tablet Take 1,000 mcg by mouth daily.    [provider]    Allergies    Bactrim [sulfamethoxazole-trimethoprim]  Review of Systems   Review of Systems  Constitutional: Negative for chills and fever.  HENT: Negative for congestion.   Eyes: Negative for pain.  Respiratory: Negative for cough and shortness of breath.   Cardiovascular: Negative for chest pain and leg swelling.  Gastrointestinal: Negative for abdominal pain and vomiting.  Genitourinary: Negative for dysuria.  Musculoskeletal: Negative for myalgias.  Skin: Negative for rash.  Neurological: Negative for dizziness  and headaches.    Physical Exam Updated Vital Signs BP (!) 145/106 (BP Location: Left Arm)   Pulse 79   Temp 98.4 F (36.9 C) (Oral)   Ht 5\' 4"  (1.626 m)   Wt 81.6 kg   SpO2 99%   BMI 30.90 kg/m   Physical Exam Vitals and nursing note reviewed.  Constitutional:      General: Heather Rocha is not in acute distress.    Appearance: Normal appearance. Heather Rocha is not ill-appearing.  HENT:     Head: Normocephalic and atraumatic.     Mouth/Throat:     Mouth: Mucous membranes are moist.  Eyes:     General: No scleral icterus.       Right eye: No discharge.        Left eye: No discharge.     Conjunctiva/sclera: Conjunctivae normal.  Cardiovascular:     Rate and Rhythm: Normal rate.      Pulses: Normal pulses.  Pulmonary:     Effort: Pulmonary effort is normal.     Breath sounds: No stridor.  Skin:    General: Skin is warm and dry.     Capillary Refill: Capillary refill takes less than 2 seconds.     Comments: No obvious bite marks on left thigh.  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  No bleeding .  Small area of bruising on the lateral left thigh  Neurological:     Mental Status: Heather Rocha is alert and oriented to person, place, and time. Mental status is at baseline.     ED Results / Procedures / Treatments   Labs (all labs ordered are listed, but only abnormal results are displayed) Labs Reviewed - No data to display  EKG None  Radiology No results found.  Procedures Procedures (including critical care time)  Medications Ordered in ED Medications  rabies immune globulin (HYPERAB/KEDRAB) injection 1,650 Units (has no administration in time range)  rabies vaccine (RABAVERT) injection 1 mL (has no administration in time range)  amoxicillin-clavulanate (AUGMENTIN) 875-125 MG per tablet 1 tablet (1 tablet Oral Given 10/27/19 1346)  Tdap (BOOSTRIX) injection 0.5 mL (0.5 mLs Intramuscular Given 10/27/19 1414)    ED Course  I have reviewed the triage vital signs and the nursing notes.  Pertinent labs & imaging results that were available during my care of the patient were reviewed by me and considered in my medical decision making (see chart for details).  Clinical Course as of Oct 26 1425  Tue Oct 27, 2019  1404 Patient requested no pregnancy test as Heather Rocha has had uterine ablation and states no possibility of pregnancy--knows the risks of taking the RIG tx while pregnant.    [WF]    Clinical Course User Index [WF] Tedd Sias, [place]   MDM Rules/Calculators/A&P                      Patient is 54 year old female presented today after possible bat bite.  Heather Rocha has been provided with Augmentin, Tdap, RIG, and first dose of  vaccination.  Heather Rocha has been provided with vaccine schedule and follow-up.  Heather Rocha has been educated on importance of having additional vaccine shots.  Heather Rocha is understanding of this.  Heather Rocha will take 7 days of Augmentin as prescribed.  Recommended probiotics prophylactically.  Patient is asymptomatic at this time.  Heather Rocha is well-appearing has vitals within normal months of low mildly elevated blood pressure likely due to stress.  Will reassess blood pressure and recommend patient  follow-up with primary care doctor back when Heather Rocha is in [place] Heather Rocha is moving there currently.  -- I discussed this case with my attending physician who cosigned this note including patient's presenting symptoms, physical exam, and planned diagnostics and interventions. Attending physician stated agreement with plan or made changes to plan which were implemented.  Attending physician assessed patient at bedside. The medical records were personally reviewed by myself. I personally reviewed all lab results and interpreted all imaging studies and either concurred with their official read or contacted radiology for clarification.   This patient appears reasonably screened and I doubt any other medical condition requiring further workup, evaluation, or treatment in the ED at this time prior to discharge.   Patient's vitals are WNL apart from vital sign abnormalities discussed above, patient is in NAD, and able to ambulate in the ED at their baseline and able to tolerate PO.  Pain has been managed or a plan has been made for home management and has no complaints prior to discharge. Patient is comfortable with above plan and for discharge at this time. All questions were answered prior to disposition. Results from the ER workup discussed with the patient face to face and all questions answered to the best of my ability. The patient is safe for discharge with strict return precautions. Patient appears safe for discharge with appropriate  follow-up. Conveyed my impression with the patient and they voiced understanding and are agreeable to plan.   An After Visit Summary was printed and given to the patient.  Portions of this note were generated with [employer]. Dictation errors may occur despite best attempts at proofreading.   Final Clinical Impression(s) / ED Diagnoses Final diagnoses:  Animal bite  Exposure to bat without known bite    Rx / DC Orders ED Discharge Orders         Ordered    amoxicillin-clavulanate (AUGMENTIN) 875-125 MG tablet  Every 12 hours     10/27/19 1343           Pati Gallo [place], [place] 10/27/19 1427    Pattricia Boss, MD 10/27/19 1515

## 2019-10-27 NOTE — Discharge Instructions (Addendum)
Please see the attached letter for information on additional vaccinations needed to complete your schedule.  He will require vaccination on day 3, 7, 14.   Please take Augmentin the antibiotic as prescribed as prescribed.

## 2020-03-07 ENCOUNTER — Other Ambulatory Visit: Payer: Self-pay | Admitting: Podiatry

## 2020-04-22 ENCOUNTER — Other Ambulatory Visit: Payer: Self-pay | Admitting: Podiatry
